# Patient Record
Sex: Female | Born: 1935 | Race: White | Hispanic: No | Marital: Married | Smoking: Never smoker
Health system: Southern US, Community
[De-identification: ages and names within clinical notes are randomized; demographics above are authoritative.]

## PROBLEM LIST (undated history)

## (undated) DIAGNOSIS — Z8719 Personal history of other diseases of the digestive system: Secondary | ICD-10-CM

## (undated) DIAGNOSIS — G2581 Restless legs syndrome: Secondary | ICD-10-CM

## (undated) DIAGNOSIS — T7840XA Allergy, unspecified, initial encounter: Secondary | ICD-10-CM

## (undated) DIAGNOSIS — D519 Vitamin B12 deficiency anemia, unspecified: Secondary | ICD-10-CM

## (undated) DIAGNOSIS — M5030 Other cervical disc degeneration, unspecified cervical region: Secondary | ICD-10-CM

## (undated) DIAGNOSIS — M8580 Other specified disorders of bone density and structure, unspecified site: Secondary | ICD-10-CM

## (undated) DIAGNOSIS — K635 Polyp of colon: Secondary | ICD-10-CM

## (undated) DIAGNOSIS — I10 Essential (primary) hypertension: Secondary | ICD-10-CM

## (undated) DIAGNOSIS — N895 Stricture and atresia of vagina: Secondary | ICD-10-CM

## (undated) DIAGNOSIS — I8390 Asymptomatic varicose veins of unspecified lower extremity: Secondary | ICD-10-CM

## (undated) HISTORY — PX: TUBAL LIGATION: SHX77

## (undated) HISTORY — DX: Other cervical disc degeneration, unspecified cervical region: M50.30

## (undated) HISTORY — PX: TONSILLECTOMY AND ADENOIDECTOMY: SUR1326

## (undated) HISTORY — DX: Essential (primary) hypertension: I10

## (undated) HISTORY — DX: Other specified disorders of bone density and structure, unspecified site: M85.80

## (undated) HISTORY — DX: Allergy, unspecified, initial encounter: T78.40XA

## (undated) HISTORY — PX: BILATERAL OOPHORECTOMY: SHX1221

## (undated) HISTORY — DX: Stricture and atresia of vagina: N89.5

## (undated) HISTORY — DX: Vitamin B12 deficiency anemia, unspecified: D51.9

## (undated) HISTORY — PX: OTHER SURGICAL HISTORY: SHX169

## (undated) HISTORY — DX: Restless legs syndrome: G25.81

## (undated) HISTORY — DX: Asymptomatic varicose veins of unspecified lower extremity: I83.90

## (undated) HISTORY — DX: Polyp of colon: K63.5

## (undated) HISTORY — PX: SALPINGECTOMY: SHX328

## (undated) HISTORY — DX: Personal history of other diseases of the digestive system: Z87.19

## (undated) HISTORY — PX: APPENDECTOMY: SHX54

---

## 1989-12-03 HISTORY — PX: ABDOMINAL HYSTERECTOMY: SHX81

## 2004-03-23 ENCOUNTER — Encounter: Payer: Self-pay | Admitting: Gastroenterology

## 2004-10-07 DIAGNOSIS — K7689 Other specified diseases of liver: Secondary | ICD-10-CM | POA: Insufficient documentation

## 2005-10-31 ENCOUNTER — Ambulatory Visit: Payer: Self-pay

## 2007-01-13 ENCOUNTER — Ambulatory Visit: Payer: Self-pay | Admitting: Gastroenterology

## 2008-01-25 ENCOUNTER — Ambulatory Visit: Payer: Self-pay | Admitting: Cardiology

## 2008-03-04 ENCOUNTER — Ambulatory Visit: Payer: Self-pay | Admitting: Pulmonary Disease

## 2008-03-04 DIAGNOSIS — J310 Chronic rhinitis: Secondary | ICD-10-CM | POA: Insufficient documentation

## 2008-03-04 DIAGNOSIS — G2581 Restless legs syndrome: Secondary | ICD-10-CM | POA: Insufficient documentation

## 2008-03-04 DIAGNOSIS — R05 Cough: Secondary | ICD-10-CM | POA: Insufficient documentation

## 2008-03-23 ENCOUNTER — Encounter (INDEPENDENT_AMBULATORY_CARE_PROVIDER_SITE_OTHER): Payer: Self-pay | Admitting: *Deleted

## 2008-03-23 ENCOUNTER — Ambulatory Visit: Payer: Self-pay | Admitting: Pulmonary Disease

## 2008-03-23 ENCOUNTER — Encounter: Payer: Self-pay | Admitting: Pulmonary Disease

## 2008-03-23 DIAGNOSIS — R0602 Shortness of breath: Secondary | ICD-10-CM | POA: Insufficient documentation

## 2008-04-05 ENCOUNTER — Ambulatory Visit (HOSPITAL_COMMUNITY): Admission: RE | Admit: 2008-04-05 | Discharge: 2008-04-05 | Payer: Self-pay | Admitting: Pulmonary Disease

## 2008-04-05 ENCOUNTER — Encounter: Payer: Self-pay | Admitting: Pulmonary Disease

## 2008-04-28 ENCOUNTER — Ambulatory Visit: Payer: Self-pay | Admitting: Internal Medicine

## 2008-05-21 ENCOUNTER — Ambulatory Visit: Payer: Self-pay | Admitting: Pulmonary Disease

## 2009-01-27 ENCOUNTER — Encounter: Admission: RE | Admit: 2009-01-27 | Discharge: 2009-01-27 | Payer: Self-pay | Admitting: Family Medicine

## 2009-01-27 ENCOUNTER — Encounter (INDEPENDENT_AMBULATORY_CARE_PROVIDER_SITE_OTHER): Payer: Self-pay | Admitting: *Deleted

## 2009-02-22 DIAGNOSIS — K922 Gastrointestinal hemorrhage, unspecified: Secondary | ICD-10-CM | POA: Insufficient documentation

## 2009-02-23 ENCOUNTER — Ambulatory Visit: Payer: Self-pay | Admitting: Gastroenterology

## 2009-02-23 DIAGNOSIS — R635 Abnormal weight gain: Secondary | ICD-10-CM | POA: Insufficient documentation

## 2009-02-23 DIAGNOSIS — K5730 Diverticulosis of large intestine without perforation or abscess without bleeding: Secondary | ICD-10-CM | POA: Insufficient documentation

## 2009-02-23 LAB — CONVERTED CEMR LAB
Albumin: 3.6 g/dL (ref 3.5–5.2)
BUN: 19 mg/dL (ref 6–23)
Basophils Relative: 0.8 % (ref 0.0–3.0)
CO2: 33 meq/L — ABNORMAL HIGH (ref 19–32)
Calcium: 9.4 mg/dL (ref 8.4–10.5)
Chloride: 102 meq/L (ref 96–112)
Creatinine, Ser: 0.9 mg/dL (ref 0.4–1.2)
Eosinophils Relative: 1.7 % (ref 0.0–5.0)
GFR calc non Af Amer: 64.84 mL/min (ref >60)
Glucose, Bld: 98 mg/dL (ref 70–99)
Hemoglobin: 13.7 g/dL (ref 12.0–15.0)
Lymphocytes Relative: 23.4 % (ref 12.0–46.0)
MCHC: 34.1 g/dL (ref 30.0–36.0)
Monocytes Relative: 9.2 % (ref 3.0–12.0)
Neutro Abs: 4.8 10*3/uL (ref 1.4–7.7)
Potassium: 4.9 meq/L (ref 3.5–5.1)
RBC: 4.35 M/uL (ref 3.87–5.11)
T4, Total: 6.6 ug/dL (ref 5.0–12.5)
WBC: 7.5 10*3/uL (ref 4.5–10.5)

## 2009-03-03 ENCOUNTER — Ambulatory Visit: Payer: Self-pay | Admitting: Gastroenterology

## 2009-10-07 DIAGNOSIS — N959 Unspecified menopausal and perimenopausal disorder: Secondary | ICD-10-CM | POA: Insufficient documentation

## 2009-10-07 DIAGNOSIS — M479 Spondylosis, unspecified: Secondary | ICD-10-CM | POA: Insufficient documentation

## 2009-11-21 ENCOUNTER — Ambulatory Visit: Admission: RE | Admit: 2009-11-21 | Discharge: 2009-11-21 | Payer: Self-pay | Admitting: Gynecologic Oncology

## 2011-04-20 NOTE — Letter (Signed)
January 13, 2007    Mrs. Veronica Mullins   RE:  Bezanson, Leylany  MRN:  [id number]  /  DOB:  12/31/1930   Dear [name]:   It is my pleasure to have treated you recently as a new patient in my  office. I appreciate your confidence and the opportunity to participate  in your care.   Since I do have a busy inpatient endoscopy schedule and office schedule,  my office hours vary weekly. I am, however, available for emergency  calls every day through my office. If I cannot promptly meet an urgent  office appointment, another one of our gastroenterologists will be able  to assist you.   My well-trained staff are prepared to help you at all times. For  emergencies after office hours, a physician from our gastroenterology  section is always available through my 24-hour answering service.   While you are under my care, I encourage discussion of your questions  and concerns, and I will be happy to return your calls as soon as I am  available.   Once again, I welcome you as a new patient and I look forward to a happy  and healthy relationship.    Sincerely,      Robert D. Kaplan, MD,FACG  Electronically Signed   RDK/MedQ  DD: 01/13/2007  DT: 01/13/2007  Job #: [id number] 

## 2011-04-20 NOTE — Letter (Signed)
January 13, 2007    Jonita Albee, M.D.  [facility]  [address]  [place], [place] [address]   RE:  Veronica Mullins, Veronica Mullins  MRN:  [id number]  /  DOB:  08/12/1931   Dear Dr. Perrin Maltese:   Upon your kind referral, I had the pleasure of evaluating your patient  and I am pleased to offer my findings. I saw Veronica Mullins in the office  today. Enclosed is a copy of my progress note that details my findings  and recommendations.   Thank you for the opportunity to participate in your patient's care.    Sincerely,      Barbette Hair. Arlyce Dice, MD,FACG  Electronically Signed    RDK/MedQ  DD: 01/13/2007  DT: 01/13/2007  Job #: [id number]

## 2011-04-20 NOTE — Assessment & Plan Note (Signed)
[facility]                         GASTROENTEROLOGY OFFICE NOTE   Veronica Mullins, Veronica Mullins                          MRN:          [id number]  DATE:01/13/2007                            DOB:          03-22-1931    REASON FOR CONSULTATION:  History of hematochezia.   Veronica Mullins is a 77 year old white female referred to the courtesy of Dr.  Perrin Maltese and for evaluation.  In 2005, she underwent incomplete colonoscopy  and barium enema for acute lower GI bleeding.  Colonoscopy to the left  colon demonstrated scattered diverticula.  Air contrast barium enema  demonstrated the same.  The remainder of the colon apparently was  normal.  Diverticula were confined to the sigmoid colon.  Veronica Mullins has  no GI complaints, including change of bowel habits, abdominal pain,  melena or hematochezia.   PAST MEDICAL HISTORY:  Pertinent for hypertension and arthritis.  She  apparently has had asthma for 2 years.  She is status post  hemorrhoidectomy, history, tubal ligation and appendectomy.  She has  restless leg syndrome.   FAMILY HISTORY:  Pertinent for mother with breast cancer.   MEDICATIONS:  Carbidopa, Topamax, Lisinopril, Proventil, K-Var spray and  B12 injections.   ALLERGIES:  SHE IS ALLERGIC TO PENICILLIN.   SOCIAL HISTORY:  She neither smokes nor drinks.  She is married and  works for the [employer] system.   REVIEW OF SYSTEMS:  Reviewed and is positive for feet swelling, frequent  nonproductive cough, joint pains, sleep problems, back pain and some  shortness of breath.   PHYSICAL EXAMINATION:  VITAL SIGNS:  Pulse 76, blood pressure 118/68,  weight 163.  HEENT:  EOMI. PERRLA. Sclerae are anicteric.  Conjunctivae are pink.  NECK:  Supple without thyromegaly, adenopathy or carotid bruits.  CHEST:  Clear to auscultation and percussion without adventitious  sounds.  CARDIAC:  Regular rhythm; normal S1 S2.  There are no murmurs, gallops  or rubs.  ABDOMEN:  Bowel sounds are normoactive.  Abdomen is soft, non-tender and  non-distended.  There are no abdominal masses, tenderness, splenic  enlargement or hepatomegaly.  EXTREMITIES:  Full range of motion.  No cyanosis, clubbing or edema.  RECTAL:  There are no masses.  Stool is Hemoccult negative.   IMPRESSION:  1. History of lower GI bleeding - most likely secondary to      diverticulosis.  2. Asthma.  Recent onset of asthma raises the question of asthma      secondary to acid reflux.   RECOMMENDATION:  No further GI workup at this time.     Barbette Hair. Arlyce Dice, MD,FACG  Electronically Signed    RDK/MedQ  DD: 01/13/2007  DT: 01/13/2007  Job #: [id number]   cc:   Jonita Albee, M.D.

## 2011-04-20 NOTE — Letter (Signed)
January 13, 2007    Mrs. Veronica Mullins   RE:  MAELA, TAKEDA  MRN:  [id number]  /  DOB:  01/12/1931   Dear [name]:   It is my pleasure to have treated you recently as a new patient in my  office. I appreciate your confidence and the opportunity to participate  in your care.   Since I do have a busy inpatient endoscopy schedule and office schedule,  my office hours vary weekly. I am, however, available for emergency  calls every day through my office. If I cannot promptly meet an urgent  office appointment, another one of our gastroenterologists will be able  to assist you.   My well-trained staff are prepared to help you at all times. For  emergencies after office hours, a physician from our gastroenterology  section is always available through my 24-hour answering service.   While you are under my care, I encourage discussion of your questions  and concerns, and I will be happy to return your calls as soon as I am  available.   Once again, I welcome you as a new patient and I look forward to a happy  and healthy relationship.    Sincerely,      Barbette Hair. Arlyce Dice, MD,FACG  Electronically Signed   RDK/MedQ  DD: 01/13/2007  DT: 01/13/2007  Job #: [id number]

## 2011-04-20 NOTE — Letter (Signed)
January 13, 2007    Jonita Albee, M.D.  [facility]  [address]  [place]  [place] [address]   RE:  [name], [name]  MRN:  [id number]  /  DOB:  Jan 06, 1931   Dear Dr. Perrin Maltese:   Upon your kind referral, I had the pleasure of evaluating your patient  and I am pleased to offer my findings.  I saw Mrs. Veronica Mullins in the  office today.  Enclosed is a copy of my progress note that details my  findings and recommendations.   Thank you for the opportunity to participate in your patient's care.    Sincerely,      Barbette Hair. Arlyce Dice, MD,FACG  Electronically Signed    RDK/MedQ  DD: 01/13/2007  DT: 01/13/2007  Job #: [id number]

## 2011-11-20 ENCOUNTER — Ambulatory Visit (INDEPENDENT_AMBULATORY_CARE_PROVIDER_SITE_OTHER): Payer: Medicare Other | Admitting: Physician Assistant

## 2011-11-20 DIAGNOSIS — R209 Unspecified disturbances of skin sensation: Secondary | ICD-10-CM

## 2011-11-20 DIAGNOSIS — E538 Deficiency of other specified B group vitamins: Secondary | ICD-10-CM

## 2011-11-20 DIAGNOSIS — G8929 Other chronic pain: Secondary | ICD-10-CM

## 2011-11-20 DIAGNOSIS — I10 Essential (primary) hypertension: Secondary | ICD-10-CM

## 2011-12-04 HISTORY — PX: CARPAL TUNNEL RELEASE: SHX101

## 2011-12-05 ENCOUNTER — Other Ambulatory Visit: Payer: Self-pay | Admitting: Emergency Medicine

## 2011-12-05 DIAGNOSIS — M542 Cervicalgia: Secondary | ICD-10-CM

## 2011-12-07 ENCOUNTER — Other Ambulatory Visit: Payer: Self-pay

## 2011-12-13 ENCOUNTER — Ambulatory Visit
Admission: RE | Admit: 2011-12-13 | Discharge: 2011-12-13 | Disposition: A | Discharge status: Home / Self Care | Payer: Medicare Other | Source: Ambulatory Visit | Attending: Emergency Medicine | Admitting: Emergency Medicine

## 2011-12-13 DIAGNOSIS — M542 Cervicalgia: Secondary | ICD-10-CM

## 2012-02-21 ENCOUNTER — Ambulatory Visit (INDEPENDENT_AMBULATORY_CARE_PROVIDER_SITE_OTHER): Payer: Medicare Other | Admitting: Physician Assistant

## 2012-02-21 ENCOUNTER — Encounter: Payer: Self-pay | Admitting: Physician Assistant

## 2012-02-21 VITALS — BP 116/68 | HR 87 | Temp 98.7°F | Resp 16 | Ht 62.5 in | Wt 167.4 lb

## 2012-02-21 DIAGNOSIS — J309 Allergic rhinitis, unspecified: Secondary | ICD-10-CM | POA: Insufficient documentation

## 2012-02-21 DIAGNOSIS — M8580 Other specified disorders of bone density and structure, unspecified site: Secondary | ICD-10-CM | POA: Insufficient documentation

## 2012-02-21 DIAGNOSIS — I10 Essential (primary) hypertension: Secondary | ICD-10-CM

## 2012-02-21 DIAGNOSIS — D519 Vitamin B12 deficiency anemia, unspecified: Secondary | ICD-10-CM | POA: Insufficient documentation

## 2012-02-21 DIAGNOSIS — G5600 Carpal tunnel syndrome, unspecified upper limb: Secondary | ICD-10-CM

## 2012-02-21 DIAGNOSIS — R202 Paresthesia of skin: Secondary | ICD-10-CM

## 2012-02-21 DIAGNOSIS — G5602 Carpal tunnel syndrome, left upper limb: Secondary | ICD-10-CM

## 2012-02-21 DIAGNOSIS — G2581 Restless legs syndrome: Secondary | ICD-10-CM

## 2012-02-21 DIAGNOSIS — G8929 Other chronic pain: Secondary | ICD-10-CM | POA: Insufficient documentation

## 2012-02-21 DIAGNOSIS — M5030 Other cervical disc degeneration, unspecified cervical region: Secondary | ICD-10-CM

## 2012-02-21 DIAGNOSIS — I8390 Asymptomatic varicose veins of unspecified lower extremity: Secondary | ICD-10-CM | POA: Insufficient documentation

## 2012-02-21 MED ORDER — SPIRONOLACTONE 50 MG PO TABS: 50.0000 mg | ORAL_TABLET | Freq: Every day | ORAL | Status: DC

## 2012-02-21 MED ORDER — AMITRIPTYLINE HCL 100 MG PO TABS: 50.0000 mg | ORAL_TABLET | Freq: Every day | ORAL | Status: DC

## 2012-02-21 MED ORDER — DULOXETINE HCL 60 MG PO CPEP: 60.0000 mg | ORAL_CAPSULE | Freq: Every day | ORAL | Status: DC

## 2012-02-21 MED ORDER — CARVEDILOL 6.25 MG PO TABS: 6.2500 mg | ORAL_TABLET | Freq: Two times a day (BID) | ORAL | Status: DC

## 2012-02-21 MED ORDER — ESOMEPRAZOLE MAGNESIUM 40 MG PO CPDR: 40.0000 mg | DELAYED_RELEASE_CAPSULE | Freq: Every day | ORAL | Status: DC

## 2012-02-21 NOTE — Patient Instructions (Signed)
If you haven't heard about the referral for the carpal tunnel  In 10 days, please call our office.  You're welcome to contact local offices to see if anyone administers the osteoporosis injectable.

## 2012-02-21 NOTE — Progress Notes (Signed)
  Subjective:    Patient ID: Veronica Mullins, female    DOB: 1931-03-29, 78 y.o.   MRN: [id number]  HPI Presents for re-evaluation of numbness in the left hand. Has had 9 PT treatments.  Initially it helped.  Then felt like one therapist over-did it and had lots of neck soreness.  Hasn't had any improvement in the numbness overall. Saw her neurologist last week and had NCS revealing both the cervical problem and CTS, and he recommended release.  Aggravating, doesn't really hurt. She isn't sure she really wants to have surgery.  Still has a bit of a runny nose and mild cough, but doesn't feel bad. She needs refills of her meds for HTN, RLS, and reflux.   Review of Systems As above.    Objective:   Physical Exam Vital signs noted. Well-developed, well nourished WF who is awake, alert and oriented, in NAD. HEENT: [place]/AT, sclera and conjunctiva are clear.   Neck: supple, non-tender, no lymphadenopathy, thyromegaly. Heart: RRR, no murmur Lungs: CTA Extremities: no cyanosis, clubbing but with mild LE edema bilaterally. Skin: warm and dry without rash.     Assessment & Plan:   1. Carpal tunnel syndrome of left wrist  Ambulatory referral to Orthopedic Surgery  2. Degenerative disc disease, cervical    3. Paresthesias in left hand    4. Restless legs syndrome (RLS)    5. HTN (hypertension)     Refilled Cymbalta, aldactone, carvedilol and nexium.  Also restarted amitriptyline-thinks she slept better with it. Re-evaluate in 4 months.

## 2012-04-04 ENCOUNTER — Other Ambulatory Visit: Payer: Self-pay | Admitting: Family Medicine

## 2012-04-04 MED ORDER — HYDROCODONE-ACETAMINOPHEN 10-500 MG PO TABS: 1.0000 | ORAL_TABLET | Freq: Two times a day (BID) | ORAL | Status: DC

## 2012-05-20 ENCOUNTER — Other Ambulatory Visit: Payer: Self-pay | Admitting: Physician Assistant

## 2012-06-26 ENCOUNTER — Encounter: Payer: Self-pay | Admitting: Physician Assistant

## 2012-06-26 ENCOUNTER — Ambulatory Visit (INDEPENDENT_AMBULATORY_CARE_PROVIDER_SITE_OTHER): Payer: Medicare Other | Admitting: Physician Assistant

## 2012-06-26 VITALS — BP 118/68 | HR 88 | Temp 98.4°F | Resp 16 | Ht 62.75 in | Wt 172.6 lb

## 2012-06-26 DIAGNOSIS — I10 Essential (primary) hypertension: Secondary | ICD-10-CM

## 2012-06-26 DIAGNOSIS — F419 Anxiety disorder, unspecified: Secondary | ICD-10-CM

## 2012-06-26 DIAGNOSIS — G8929 Other chronic pain: Secondary | ICD-10-CM

## 2012-06-26 DIAGNOSIS — E538 Deficiency of other specified B group vitamins: Secondary | ICD-10-CM

## 2012-06-26 DIAGNOSIS — R05 Cough: Secondary | ICD-10-CM

## 2012-06-26 DIAGNOSIS — F329 Major depressive disorder, single episode, unspecified: Secondary | ICD-10-CM

## 2012-06-26 DIAGNOSIS — F341 Dysthymic disorder: Secondary | ICD-10-CM

## 2012-06-26 LAB — COMPREHENSIVE METABOLIC PANEL
ALT: 10 U/L (ref 0–35)
Alkaline Phosphatase: 93 U/L (ref 39–117)
Creat: 0.79 mg/dL (ref 0.50–1.10)
Sodium: 139 mEq/L (ref 135–145)
Total Bilirubin: 0.5 mg/dL (ref 0.3–1.2)
Total Protein: 6.9 g/dL (ref 6.0–8.3)

## 2012-06-26 LAB — VITAMIN B12: Vitamin B-12: 467 pg/mL (ref 211–911)

## 2012-06-26 MED ORDER — VENLAFAXINE HCL ER 75 MG PO CP24: 75.0000 mg | ORAL_CAPSULE | Freq: Every day | ORAL | Status: DC

## 2012-06-26 MED ORDER — HYDROCODONE-ACETAMINOPHEN 10-500 MG PO TABS: 1.0000 | ORAL_TABLET | Freq: Three times a day (TID) | ORAL | Status: DC | PRN

## 2012-06-26 MED ORDER — IPRATROPIUM BROMIDE 0.03 % NA SOLN: 2.0000 | Freq: Two times a day (BID) | NASAL | Status: DC

## 2012-06-26 MED ORDER — MOXIFLOXACIN HCL 400 MG PO TABS: 400.0000 mg | ORAL_TABLET | Freq: Every day | ORAL | Status: AC

## 2012-06-26 MED ORDER — GUAIFENESIN ER 1200 MG PO TB12: 1.0000 | ORAL_TABLET | Freq: Two times a day (BID) | ORAL | Status: DC | PRN

## 2012-06-26 NOTE — Patient Instructions (Signed)
Keep doing-the things you want to and can.

## 2012-06-26 NOTE — Progress Notes (Signed)
Subjective:    Patient ID: Veronica Mullins, female    DOB: 08-24-1931, 78 y.o.   MRN: [id number]  HPI This 78 y.o. Female presents for follow-up of chronic problems, and recurrent cough.  She's had increased sinus pressure and drainage, which she reports tastes foul, and her cough produces yellow sputum.  No SOB.  Also, her mood remains low, "blue."  She notes irritability.  She's been taking only 30 mg of Cymbalta for the last month, since it's no longer covered by her pharmacy benefit plan. Her portion is $80/month (was started by Dr. Hal Hope for anxiety).  She'd like to try an alternative.  Review of Systems  Constitutional: Positive for unexpected weight change (up and down, sometimes daily (ongoing x months-years)).  HENT: Positive for hearing loss (baseline), congestion, rhinorrhea, postnasal drip and sinus pressure. Negative for ear pain, sore throat and trouble swallowing.   Eyes: Negative.   Respiratory: Positive for cough, shortness of breath and wheezing.   Cardiovascular: Positive for leg swelling. Negative for chest pain and palpitations.  Gastrointestinal: Negative.   Genitourinary: Negative.   Musculoskeletal: Positive for myalgias, back pain and arthralgias. Negative for joint swelling and gait problem.  Skin: Negative.   Neurological: Negative.   Hematological: Negative.   Psychiatric/Behavioral: Positive for dysphoric mood. Negative for self-injury. The patient is nervous/anxious.       Past Medical History  Diagnosis Date  . Allergy   . Hypertension   . Varicose veins   . RLS (restless legs syndrome)   . Asthma   . B12 deficiency anemia   . History of GI bleed     colon polyps  . Colon polyps   . Osteopenia   . Vaginal stricture   . Vitamin d deficiency   . DDD (degenerative disc disease), cervical     causes numbness in left fingers, but hopes to avoid surgery    Past Surgical History  Procedure Date  . Tonsillectomy and adenoidectomy   . Tubal ligation     . Appendectomy   . Cataract   . Bilateral oophorectomy   . Salpingectomy     bilateral  . Carpal tunnel release 2013    left hand    Prior to Admission medications   Medication Sig Start Date End Date Taking? Authorizing Provider  albuterol (PROVENTIL HFA;VENTOLIN HFA) 108 (90 BASE) MCG/ACT inhaler Inhale 2 puffs into the lungs every 6 (six) hours as needed.   Yes Historical Provider, MD  alendronate (FOSAMAX) 70 MG tablet Take 70 mg by mouth every 7 (seven) days. Take with a full glass of water on an empty stomach.   Yes Historical Provider, MD  amitriptyline (ELAVIL) 100 MG tablet Take 0.5-1 tablets (50-100 mg total) by mouth at bedtime. 02/21/12 02/20/13 Yes Noboru Bidinger S Diera Wirkkala, PA-C  carbidopa-levodopa (SINEMET IR) 25-250 MG per tablet  06/06/12  Yes Historical Provider, MD  carvedilol (COREG) 6.25 MG tablet Take 1 tablet (6.25 mg total) by mouth 2 (two) times daily with a meal. 02/21/12  Yes Araina Butrick S Agnes Brightbill, PA-C  DULoxetine (CYMBALTA) 60 MG capsule Take 1 capsule (60 mg total) by mouth daily. 02/21/12  Yes Makana Feigel S Ajwa Kimberley, PA-C  esomeprazole (NEXIUM) 40 MG capsule Take 1 capsule (40 mg total) by mouth daily before breakfast. 02/21/12  Yes Larenda Reedy S Tanielle Emigh, PA-C  Fluticasone-Salmeterol (ADVAIR DISKUS) 250-50 MCG/DOSE AEPB Inhale 1 puff into the lungs every 12 (twelve) hours.   Yes Historical Provider, MD  HYDROcodone-acetaminophen (LORTAB) 10-500 MG per tablet TAKE 1 TABLET  BY MOUTH TWICE DAILY 05/20/12  Yes Pattricia Boss, PA-C  Rotigotine (NEUPRO) 3 MG/24HR PT24 Place onto the skin.   Yes Historical Provider, MD  spironolactone (ALDACTONE) 50 MG tablet Take 1 tablet (50 mg total) by mouth daily. 02/21/12  Yes Daneli Butkiewicz S Marvelous Woolford, PA-C  vitamin B-12 (CYANOCOBALAMIN) 1000 MCG tablet Take 1,000 mcg by mouth daily.   Yes Historical Provider, MD    Allergies  Allergen Reactions  . Penicillins     Throat swells    History   Social History  . Marital Status: Married    Spouse Name: N/A     Number of Children: N/A  . Years of Education: N/A   Occupational History  . Not on file.   Social History Main Topics  . Smoking status: Never Smoker   . Smokeless tobacco: Not on file  . Alcohol Use: No  . Drug Use: No  . Sexually Active: Not on file   Other Topics Concern  . Not on file   Social History Narrative  . No narrative on file    Family History  Problem Relation Age of Onset  . Cancer Mother     breast  . Cancer Father     prostate       Objective:   Physical Exam  Vitals reviewed. Constitutional: She is oriented to person, place, and time. Vital signs are normal. She appears well-developed and well-nourished. No distress.  HENT:  Head: Normocephalic and atraumatic.  Right Ear: Hearing, tympanic membrane, external ear and ear canal normal.  Left Ear: Hearing, tympanic membrane, external ear and ear canal normal.  Nose: Mucosal edema and rhinorrhea present.  No foreign bodies. Right sinus exhibits maxillary sinus tenderness. Right sinus exhibits no frontal sinus tenderness. Left sinus exhibits maxillary sinus tenderness. Left sinus exhibits no frontal sinus tenderness.  Mouth/Throat: Uvula is midline, oropharynx is clear and moist and mucous membranes are normal. No uvula swelling. No oropharyngeal exudate.       Wears bilateral hearing aids  Eyes: Conjunctivae and EOM are normal. Pupils are equal, round, and reactive to light. Right eye exhibits no discharge. Left eye exhibits no discharge. No scleral icterus.  Neck: Trachea normal, normal range of motion and full passive range of motion without pain. Neck supple. No mass and no thyromegaly present.  Cardiovascular: Normal rate, regular rhythm and normal heart sounds.   Pulmonary/Chest: Effort normal. She has no decreased breath sounds. She has wheezes in the right lower field. She has rhonchi in the right lower field. She has no rales.  Musculoskeletal:       Lumbar back: She exhibits tenderness and pain.  She exhibits no bony tenderness, no swelling, no edema and no spasm.  Lymphadenopathy:       Head (right side): No submandibular, no tonsillar, no preauricular, no posterior auricular and no occipital adenopathy present.       Head (left side): No submandibular, no tonsillar, no preauricular and no occipital adenopathy present.    She has no cervical adenopathy.       Right: No supraclavicular adenopathy present.       Left: No supraclavicular adenopathy present.  Neurological: She is alert and oriented to person, place, and time. She has normal strength. No cranial nerve deficit or sensory deficit.  Skin: Skin is warm, dry and intact. No rash noted.  Psychiatric: She has a normal mood and affect. Her speech is normal and behavior is normal.      Assessment &  Plan:   1. Chronic pain  Continue Norco as before  2. Cough, due to reactive airways caused by sinusitis drainage  moxifloxacin (AVELOX) 400 MG tablet, ipratropium (ATROVENT) 0.03 % nasal spray, Guaifenesin (MUCINEX MAXIMUM STRENGTH) 1200 MG TB12  3. HTN (hypertension)  Comprehensive metabolic panel; continue current regimen  4. B12 deficiency  Vitamin B12 level (send result to neurologist)  5. Anxiety and depression  venlafaxine XR (EFFEXOR XR) 75 MG 24 hr capsule

## 2012-06-27 ENCOUNTER — Encounter: Payer: Self-pay | Admitting: Physician Assistant

## 2012-07-03 ENCOUNTER — Other Ambulatory Visit: Payer: Self-pay | Admitting: Physician Assistant

## 2012-07-31 ENCOUNTER — Ambulatory Visit (INDEPENDENT_AMBULATORY_CARE_PROVIDER_SITE_OTHER): Payer: Medicare Other | Admitting: Physician Assistant

## 2012-07-31 ENCOUNTER — Encounter: Payer: Self-pay | Admitting: Physician Assistant

## 2012-07-31 VITALS — BP 118/62 | HR 65 | Temp 98.0°F | Resp 16 | Ht 62.5 in | Wt 171.6 lb

## 2012-07-31 DIAGNOSIS — F341 Dysthymic disorder: Secondary | ICD-10-CM

## 2012-07-31 DIAGNOSIS — F329 Major depressive disorder, single episode, unspecified: Secondary | ICD-10-CM

## 2012-07-31 DIAGNOSIS — G8929 Other chronic pain: Secondary | ICD-10-CM

## 2012-07-31 DIAGNOSIS — T753XXA Motion sickness, initial encounter: Secondary | ICD-10-CM

## 2012-07-31 DIAGNOSIS — J45909 Unspecified asthma, uncomplicated: Secondary | ICD-10-CM

## 2012-07-31 MED ORDER — VENLAFAXINE HCL ER 75 MG PO CP24: 75.0000 mg | ORAL_CAPSULE | Freq: Every day | ORAL | Status: DC

## 2012-07-31 MED ORDER — SCOPOLAMINE 1 MG/3DAYS TD PT72: 1.0000 | MEDICATED_PATCH | TRANSDERMAL | Status: DC

## 2012-07-31 MED ORDER — HYDROCODONE-ACETAMINOPHEN 10-500 MG PO TABS: 1.0000 | ORAL_TABLET | Freq: Three times a day (TID) | ORAL | Status: DC | PRN

## 2012-07-31 MED ORDER — VENLAFAXINE HCL ER 150 MG PO CP24: 75.0000 mg | ORAL_CAPSULE | Freq: Every day | ORAL | Status: DC

## 2012-07-31 NOTE — Progress Notes (Addendum)
Subjective:    Patient ID: Veronica Mullins, female    DOB: 05-02-1931, 78 y.o.   MRN: [id number]  HPI  This 78 y.o. female presents for evaluation of recent asthma.  Tolerated the Avelox without difficulty.  Has had intermittent wheezing, but not persistently.  Her primary concern remains daily fluctuations in weight, as much as 7 pounds over the course of the day. She is wearing compression stockings with good results on the ankle swelling.  She wrote down her weights, as I asked, but forgot to bring it with her. She thinks her anxiety and depression would be better controlled on a higher dose of venlafaxine (currently on 75 mg). Otherwise, she's doing well, and is preparing for a cruise with her family this Thanksgiving. She requests a prescription for seasickness.   Review of Systems No chest pain, HA, dizziness, vision change, N/V, diarrhea, constipation, dysuria, urinary urgency or frequency, or rash.   Past Medical History  Diagnosis Date  . Allergy   . Hypertension   . Varicose veins   . RLS (restless legs syndrome)   . Asthma   . B12 deficiency anemia   . History of GI bleed     colon polyps  . Colon polyps   . Osteopenia   . Vaginal stricture   . Vitamin d deficiency   . DDD (degenerative disc disease), cervical     causes numbness in left fingers, but hopes to avoid surgery    Past Surgical History  Procedure Date  . Tonsillectomy and adenoidectomy   . Tubal ligation   . Appendectomy   . Cataract   . Bilateral oophorectomy   . Salpingectomy     bilateral  . Carpal tunnel release 2013    left hand    Prior to Admission medications   Medication Sig Start Date End Date Taking? Authorizing Provider  albuterol (PROVENTIL HFA;VENTOLIN HFA) 108 (90 BASE) MCG/ACT inhaler Inhale 2 puffs into the lungs every 6 (six) hours as needed.   Yes Historical Provider, MD  alendronate (FOSAMAX) 70 MG tablet Take 70 mg by mouth every 7 (seven) days. Take with a full glass of water on  an empty stomach.   Yes Historical Provider, MD  amitriptyline (ELAVIL) 100 MG tablet Take 0.5-1 tablets (50-100 mg total) by mouth at bedtime. 02/21/12 02/20/13 Yes Traeh Milroy S Shenice Dolder, PA-C  carbidopa-levodopa (SINEMET IR) 25-250 MG per tablet  06/06/12  Yes Historical Provider, MD  carvedilol (COREG) 6.25 MG tablet Take 1 tablet (6.25 mg total) by mouth 2 (two) times daily with a meal. 02/21/12  Yes Zacarias Krauter S Vidit Boissonneault, PA-C  esomeprazole (NEXIUM) 40 MG capsule Take 1 capsule (40 mg total) by mouth daily before breakfast. 02/21/12  Yes Kimberlynn Lumbra S Zamira Hickam, PA-C  Fluticasone-Salmeterol (ADVAIR DISKUS) 250-50 MCG/DOSE AEPB Inhale 1 puff into the lungs every 12 (twelve) hours.   Yes Historical Provider, MD  HYDROcodone-acetaminophen (LORTAB) 10-500 MG per tablet Take 1 tablet by mouth every 8 (eight) hours as needed for pain. 06/26/12  Yes Corianna Avallone S Kielyn Kardell, PA-C  ipratropium (ATROVENT) 0.03 % nasal spray Place 2 sprays into the nose 2 (two) times daily. 06/26/12 06/26/13 Yes Jodeci Rini S Giannah Zavadil, PA-C  Rotigotine (NEUPRO) 3 MG/24HR PT24 Place onto the skin.   Yes Historical Provider, MD  spironolactone (ALDACTONE) 50 MG tablet Take 1 tablet (50 mg total) by mouth daily. 02/21/12  Yes Ryanna Teschner S Murrell Dome, PA-C  venlafaxine XR (EFFEXOR XR) 75 MG 24 hr capsule Take 1 capsule (75 mg total) by mouth  daily. 06/26/12 06/26/13 Yes Zariah Cavendish S Margarite Vessel, PA-C  vitamin B-12 (CYANOCOBALAMIN) 1000 MCG tablet Inject 1,000 mcg as directed every 30 (thirty) days.    Yes Historical Provider, MD    Allergies  Allergen Reactions  . Penicillins     Throat swells    History   Social History  . Marital Status: Married    Spouse Name: Alden Server    Number of Children: 2  . Years of Education: 14   Occupational History  . Retired    Social History Main Topics  . Smoking status: Never Smoker   . Smokeless tobacco: Never Used  . Alcohol Use: No  . Drug Use: No  . Sexually Active: No   Other Topics Concern  . Not on file   Social History  Narrative  . No narrative on file    Family History  Problem Relation Age of Onset  . Cancer Mother     breast  . Cancer Father     prostate       Objective:   Physical Exam  Blood pressure 118/62, pulse 65, temperature 98 F (36.7 C), temperature source Oral, resp. rate 16, height 5' 2.5" (1.588 m), weight 171 lb 9.6 oz (77.837 kg), SpO2 94.00%. Body mass index is 30.89 kg/(m^2). Well-developed, well nourished WF who is awake, alert and oriented, in NAD. HEENT: [place]/AT, PERRL, EOMI.  Sclera and conjunctiva are clear.  Bilateral hearing aids. EAC are patent, TMs are normal in appearance. Nasal mucosa is pink and moist. OP is clear. Neck: supple, non-tender, no lymphadenopathy, thyromegaly. Heart: RRR, no murmur Lungs: normal effort, CTA without wheezing. Abdomen: normo-active bowel sounds, supple, non-tender, no mass or organomegaly. Extremities: no cyanosis, clubbing or edema. Skin: warm and dry without rash.     Assessment & Plan:   1. Anxiety and depression  venlafaxine XR (EFFEXOR XR) 150 MG 24 hr capsule, DISCONTINUED: venlafaxine XR (EFFEXOR XR) 75 MG 24 hr capsule  2. Chronic pain  HYDROcodone-acetaminophen (LORTAB) 10-500 MG per tablet  3. Seasickness  scopolamine (TRANSDERM-SCOP) 1.5 MG

## 2012-07-31 NOTE — Patient Instructions (Signed)
At your next visit, we'll update fasting labs, unless you've had them done elsewhere in the meantime. ENJOY yourself on the cruise!

## 2012-09-15 ENCOUNTER — Other Ambulatory Visit: Payer: Self-pay | Admitting: Physician Assistant

## 2012-09-16 NOTE — Telephone Encounter (Signed)
At tl desk 

## 2012-09-17 ENCOUNTER — Other Ambulatory Visit: Payer: Self-pay

## 2012-11-04 ENCOUNTER — Ambulatory Visit: Payer: Medicare Other | Admitting: Physician Assistant

## 2012-11-13 ENCOUNTER — Encounter: Payer: Self-pay | Admitting: Physician Assistant

## 2012-11-13 ENCOUNTER — Ambulatory Visit (INDEPENDENT_AMBULATORY_CARE_PROVIDER_SITE_OTHER): Payer: Medicare Other | Admitting: Physician Assistant

## 2012-11-13 VITALS — BP 129/71 | HR 90 | Temp 98.1°F | Resp 18 | Ht 63.5 in | Wt 167.0 lb

## 2012-11-13 DIAGNOSIS — K219 Gastro-esophageal reflux disease without esophagitis: Secondary | ICD-10-CM

## 2012-11-13 DIAGNOSIS — F329 Major depressive disorder, single episode, unspecified: Secondary | ICD-10-CM

## 2012-11-13 DIAGNOSIS — J45909 Unspecified asthma, uncomplicated: Secondary | ICD-10-CM

## 2012-11-13 DIAGNOSIS — I10 Essential (primary) hypertension: Secondary | ICD-10-CM

## 2012-11-13 DIAGNOSIS — D518 Other vitamin B12 deficiency anemias: Secondary | ICD-10-CM

## 2012-11-13 DIAGNOSIS — R059 Cough, unspecified: Secondary | ICD-10-CM

## 2012-11-13 DIAGNOSIS — E039 Hypothyroidism, unspecified: Secondary | ICD-10-CM

## 2012-11-13 DIAGNOSIS — R05 Cough: Secondary | ICD-10-CM

## 2012-11-13 DIAGNOSIS — J310 Chronic rhinitis: Secondary | ICD-10-CM

## 2012-11-13 DIAGNOSIS — M899 Disorder of bone, unspecified: Secondary | ICD-10-CM

## 2012-11-13 DIAGNOSIS — F32A Depression, unspecified: Secondary | ICD-10-CM

## 2012-11-13 DIAGNOSIS — G8929 Other chronic pain: Secondary | ICD-10-CM

## 2012-11-13 DIAGNOSIS — M8580 Other specified disorders of bone density and structure, unspecified site: Secondary | ICD-10-CM

## 2012-11-13 DIAGNOSIS — F419 Anxiety disorder, unspecified: Secondary | ICD-10-CM | POA: Insufficient documentation

## 2012-11-13 DIAGNOSIS — D519 Vitamin B12 deficiency anemia, unspecified: Secondary | ICD-10-CM

## 2012-11-13 MED ORDER — ALBUTEROL SULFATE HFA 108 (90 BASE) MCG/ACT IN AERS: 2.0000 | INHALATION_SPRAY | RESPIRATORY_TRACT | Status: DC | PRN

## 2012-11-13 MED ORDER — FLUTICASONE-SALMETEROL 250-50 MCG/DOSE IN AEPB: 1.0000 | INHALATION_SPRAY | Freq: Two times a day (BID) | RESPIRATORY_TRACT | Status: DC

## 2012-11-13 MED ORDER — AMITRIPTYLINE HCL 100 MG PO TABS: 50.0000 mg | ORAL_TABLET | Freq: Every day | ORAL | Status: DC

## 2012-11-13 MED ORDER — DULOXETINE HCL 60 MG PO CPEP: 60.0000 mg | ORAL_CAPSULE | Freq: Every day | ORAL | Status: DC

## 2012-11-13 MED ORDER — HYDROCODONE-ACETAMINOPHEN 10-500 MG PO TABS: 1.0000 | ORAL_TABLET | Freq: Three times a day (TID) | ORAL | Status: DC | PRN

## 2012-11-13 MED ORDER — IPRATROPIUM BROMIDE 0.03 % NA SOLN: 2.0000 | Freq: Two times a day (BID) | NASAL | Status: DC

## 2012-11-13 MED ORDER — CARVEDILOL 6.25 MG PO TABS: 6.2500 mg | ORAL_TABLET | Freq: Two times a day (BID) | ORAL | Status: DC

## 2012-11-13 MED ORDER — ALENDRONATE SODIUM 70 MG PO TABS: 70.0000 mg | ORAL_TABLET | ORAL | Status: DC

## 2012-11-13 MED ORDER — SPIRONOLACTONE 50 MG PO TABS: 50.0000 mg | ORAL_TABLET | Freq: Every day | ORAL | Status: DC

## 2012-11-13 MED ORDER — ESOMEPRAZOLE MAGNESIUM 40 MG PO CPDR: 40.0000 mg | DELAYED_RELEASE_CAPSULE | Freq: Every day | ORAL | Status: DC

## 2012-11-13 NOTE — Patient Instructions (Signed)
Continue the antibiotic as prescribed.  You should have a follow-up chest xray in 4 weeks.  Continue to rest and drink fluids.

## 2012-11-13 NOTE — Progress Notes (Signed)
Subjective:    Patient ID: Veronica Mullins, female    DOB: July 01, 1931, 78 y.o.   MRN: [id number]  HPI  This 78 y.o. female presents for evaluation of illness.  Returned from a cruise with her family and developed diarrhea on porting in [place] 11/05/2012.  They drove home 12/05 and she developed a cough.  As her cough progressed, the diarrhea resolved, and she presented to another facility on 12/07 where she was diagnosed with pneumonia.  She was treated with Levaquin 500 mg QD x 14 days, and Robitussin (minimal relief with this), and advised to follow-up there or here this week.  As she already had a visit scheduled here today for follow-up of her chronic issues, she elected to come here.    She feels weaker than she did 12/07, and has no appetite.  Working on eating and drinking to prevent dehydration.  The coughing is much improved, as is rhinorrhea.  Has "been in bed" since 12/07.  No fever/chills the entire course of illness thus far.  Mammogram is scheduled for next month.  Has had a big improvement in her "fluid" and is pleased.  Doesn't feel as swollen or bloated. Pain is adequately controlled with current treatment.  Wants to switch back to Cymbalta from Effexor.  She'd switched for cost, but notes the Effexor isn't as effective, even at the higher dose, and doesn't cost much less.  Past Medical History  Diagnosis Date  . Allergy   . Hypertension   . Varicose veins   . RLS (restless legs syndrome)   . Asthma   . B12 deficiency anemia   . History of GI bleed     colon polyps  . Colon polyps   . Osteopenia   . Vaginal stricture   . Vitamin D deficiency   . DDD (degenerative disc disease), cervical     causes numbness in left fingers, but hopes to avoid surgery    Past Surgical History  Procedure Date  . Tonsillectomy and adenoidectomy   . Tubal ligation   . Appendectomy   . Cataract   . Bilateral oophorectomy   . Salpingectomy     bilateral  . Carpal tunnel release 2013     left hand    Prior to Admission medications   Medication Sig Start Date End Date Taking? Authorizing Provider  albuterol (PROVENTIL HFA;VENTOLIN HFA) 108 (90 BASE) MCG/ACT inhaler Inhale 2 puffs into the lungs every 6 (six) hours as needed.   Yes Historical Provider, MD  alendronate (FOSAMAX) 70 MG tablet Take 70 mg by mouth every 7 (seven) days. Take with a full glass of water on an empty stomach.   Yes Historical Provider, MD  amitriptyline (ELAVIL) 100 MG tablet Take 0.5-1 tablets (50-100 mg total) by mouth at bedtime. 02/21/12 02/20/13 Yes Lyndal Alamillo S Jasimine Simms, PA-C  carbidopa-levodopa (SINEMET IR) 25-250 MG per tablet  06/06/12  Yes Historical Provider, MD  carvedilol (COREG) 6.25 MG tablet Take 1 tablet (6.25 mg total) by mouth 2 (two) times daily with a meal. 02/21/12  Yes Keylin Podolsky S Jarmal Lewelling, PA-C  esomeprazole (NEXIUM) 40 MG capsule Take 1 capsule (40 mg total) by mouth daily before breakfast. 02/21/12  Yes Wayland Baik S Joceline Hinchcliff, PA-C  Fluticasone-Salmeterol (ADVAIR DISKUS) 250-50 MCG/DOSE AEPB Inhale 1 puff into the lungs every 12 (twelve) hours.   Yes Historical Provider, MD  guaiFENesin (ROBITUSSIN) 100 MG/5ML liquid Take 200 mg by mouth 3 (three) times daily as needed.   Yes Historical Provider, MD  HYDROcodone-acetaminophen (  LORTAB) 10-500 MG per tablet TAKE 1 TABLET BY MOUTH EVERY 8 HOURS AS NEEDED FOR PAIN 09/15/12  Yes Morrell Riddle, PA-C  ipratropium (ATROVENT) 0.03 % nasal spray Place 2 sprays into the nose 2 (two) times daily. 06/26/12 06/26/13 Yes Aleia Larocca S Geroge Gilliam, PA-C  levofloxacin (LEVAQUIN) 500 MG tablet Take 500 mg by mouth daily.   Yes Historical Provider, MD  Rotigotine (NEUPRO) 3 MG/24HR PT24 Place onto the skin.   Yes Historical Provider, MD  spironolactone (ALDACTONE) 50 MG tablet Take 1 tablet (50 mg total) by mouth daily. 02/21/12  Yes Mayo Owczarzak S Shantanique Hodo, PA-C  venlafaxine XR (EFFEXOR XR) 150 MG 24 hr capsule Take 1 capsule (150 mg total) by mouth daily. 07/31/12 07/31/13 Yes Adalena Abdulla S  Renai Lopata, PA-C  vitamin B-12 (CYANOCOBALAMIN) 1000 MCG tablet Inject 1,000 mcg as directed every 30 (thirty) days.    Yes Historical Provider, MD    Allergies  Allergen Reactions  . Penicillins     Throat swells    History   Social History  . Marital Status: Married    Spouse Name: Alden Server    Number of Children: 2  . Years of Education: 14   Occupational History  . Retired    Social History Main Topics  . Smoking status: Never Smoker   . Smokeless tobacco: Never Used  . Alcohol Use: No  . Drug Use: No  . Sexually Active: No   Other Topics Concern  . Not on file   Social History Narrative   Lives with her husband.    Family History  Problem Relation Age of Onset  . Cancer Mother     breast  . Cancer Father     prostate    Review of Systems As above.  No chest pain, HA, dizziness, vision change, N/V, diarrhea, constipation, dysuria, urinary urgency or frequency, or rash.     Objective:   Physical Exam  Vitals reviewed. Constitutional: She is oriented to person, place, and time. Vital signs are normal. She appears well-developed and well-nourished. She is active and cooperative. No distress.       No makeup today-hair not done recently.  She looks tired.  HENT:  Head: Normocephalic and atraumatic.  Right Ear: Tympanic membrane, external ear and ear canal normal.  Left Ear: Tympanic membrane, external ear and ear canal normal.  Nose: Mucosal edema and rhinorrhea present. Right sinus exhibits no maxillary sinus tenderness and no frontal sinus tenderness. Left sinus exhibits no maxillary sinus tenderness and no frontal sinus tenderness.  Mouth/Throat: Uvula is midline, oropharynx is clear and moist and mucous membranes are normal.       Bilateral hearing aids removed for exam.  Eyes: Conjunctivae normal, EOM and lids are normal. Pupils are equal, round, and reactive to light. No scleral icterus.  Neck: Normal range of motion. Neck supple. No thyromegaly present.   Cardiovascular: Normal rate, regular rhythm and normal heart sounds.   Pulses:      Radial pulses are 2+ on the right side, and 2+ on the left side.       Dorsalis pedis pulses are 2+ on the right side, and 2+ on the left side.       Posterior tibial pulses are 2+ on the right side, and 2+ on the left side.  Pulmonary/Chest: Effort normal. No respiratory distress. She has wheezes (soft, end-expiratory throughout). She has rhonchi in the right upper field, the right middle field and the right lower field. She has no rales.  She exhibits no tenderness.  Lymphadenopathy:       Head (right side): No tonsillar, no preauricular, no posterior auricular and no occipital adenopathy present.       Head (left side): No tonsillar, no preauricular, no posterior auricular and no occipital adenopathy present.    She has no cervical adenopathy.       Right: No supraclavicular adenopathy present.       Left: No supraclavicular adenopathy present.  Neurological: She is alert and oriented to person, place, and time. No sensory deficit.  Skin: Skin is warm, dry and intact. No rash noted. No cyanosis or erythema. Nails show no clubbing.  Psychiatric: She has a normal mood and affect. Her behavior is normal. Judgment and thought content normal.      Assessment & Plan:   1. Cough secondary to pneumonia, improving Complete antibiotic therapy; Repeat CXR in 4 weeks; albuterol (PROVENTIL HFA;VENTOLIN HFA) 108 (90 BASE) MCG/ACT inhaler  2. Essential hypertension, benign -controlled spironolactone (ALDACTONE) 50 MG tablet, carvedilol (COREG) 6.25 MG tablet, Comprehensive metabolic panel, Lipid panel  3. Chronic pain -controlled HYDROcodone-acetaminophen (LORTAB) 10-500 MG per tablet  4. RHINITIS  ipratropium (ATROVENT) 0.03 % nasal spray  5. B12 deficiency anemia  Continue B12 injections  6. Osteopenia  alendronate (FOSAMAX) 70 MG tablet  7. Asthma  Fluticasone-Salmeterol (ADVAIR DISKUS) 250-50 MCG/DOSE AEPB,  albuterol (PROVENTIL HFA;VENTOLIN HFA) 108 (90 BASE) MCG/ACT inhaler  8. GERD (gastroesophageal reflux disease)  esomeprazole (NEXIUM) 40 MG capsule  9. Depression  amitriptyline (ELAVIL) 100 MG tablet, D/C Effexor and resume DULoxetine (CYMBALTA) 60 MG capsule  10. Hypothyroidism  TSH

## 2012-11-14 ENCOUNTER — Encounter: Payer: Self-pay | Admitting: Physician Assistant

## 2012-11-14 LAB — COMPREHENSIVE METABOLIC PANEL
ALT: 28 U/L (ref 0–35)
Albumin: 3.7 g/dL (ref 3.5–5.2)
CO2: 27 mEq/L (ref 19–32)
Glucose, Bld: 99 mg/dL (ref 70–99)
Potassium: 4.2 mEq/L (ref 3.5–5.3)
Sodium: 138 mEq/L (ref 135–145)
Total Protein: 6.5 g/dL (ref 6.0–8.3)

## 2012-11-14 LAB — LIPID PANEL
Cholesterol: 104 mg/dL (ref 0–200)
LDL Cholesterol: 41 mg/dL (ref 0–99)
Triglycerides: 165 mg/dL — ABNORMAL HIGH (ref <150)

## 2012-11-20 ENCOUNTER — Ambulatory Visit: Payer: Medicare Other | Admitting: Physician Assistant

## 2012-12-11 ENCOUNTER — Ambulatory Visit (INDEPENDENT_AMBULATORY_CARE_PROVIDER_SITE_OTHER): Payer: Medicare Other | Admitting: Physician Assistant

## 2012-12-11 ENCOUNTER — Encounter: Payer: Self-pay | Admitting: Physician Assistant

## 2012-12-11 VITALS — BP 124/60 | HR 75 | Temp 97.7°F | Resp 16 | Ht 64.5 in | Wt 164.8 lb

## 2012-12-11 DIAGNOSIS — K219 Gastro-esophageal reflux disease without esophagitis: Secondary | ICD-10-CM

## 2012-12-11 DIAGNOSIS — R05 Cough: Secondary | ICD-10-CM

## 2012-12-11 MED ORDER — OMEPRAZOLE 40 MG PO CPDR: 40.0000 mg | DELAYED_RELEASE_CAPSULE | Freq: Every day | ORAL | Status: DC

## 2012-12-11 MED ORDER — FLUTICASONE-SALMETEROL 500-50 MCG/DOSE IN AEPB: 1.0000 | INHALATION_SPRAY | Freq: Two times a day (BID) | RESPIRATORY_TRACT | Status: DC

## 2012-12-11 NOTE — Patient Instructions (Signed)
Use Mucinex Maximum Strength, 1200 mg, one tablet twice each day to keep the mucous thin and watery. Use the Advair Diskus 500/50 1 puff twice daily for 1 month.  Then go back to the Advair Diskus 250/50.  Remember to rinse your mouth out or brush you teeth after each dose.  Other medications for treatment of reflux: Omeprazole 20 mg, 40 mg (this is the generic name of Prilosec and can also be purchased over-the-counter at the 20 mg dose) Protonix (pantoprazole) 40 mg Aciphex (rabeprazole) 20 mg Prevacid (lansoprazole) 30 mg, 60 mg

## 2012-12-12 ENCOUNTER — Encounter: Payer: Self-pay | Admitting: *Deleted

## 2012-12-12 ENCOUNTER — Encounter: Payer: Self-pay | Admitting: Physician Assistant

## 2012-12-12 NOTE — Progress Notes (Signed)
  Subjective:    Patient ID: Veronica Mullins, female    DOB: August 08, 1931, 79 y.o.   MRN: [id number]  HPI This 79 y.o. female presents for evaluation of cough.  Since her visit 11/13/12, the other urgent care facility called her with the results of the CXR they ordered.  They advised her that she had a more serious infection than they initially thought and added Biaxin to her regimen of Levaquin.  In addition, she was given a guaifenisen cough syrup and a steroid dose-pack.  She is still not back to baseline, continues to cough, but feels much better than before.  No fever, chills, N/V/D.  No CP, HA, increased sinus pressure/congestion.  SOB only with coughing.  No LE edema.  Review of Systems As above.    Objective:   Physical Exam Blood pressure 124/60, pulse 75, temperature 97.7 F (36.5 C), temperature source Oral, resp. rate 16, height 5' 4.5" (1.638 m), weight 164 lb 12.8 oz (74.753 kg), SpO2 94.00%. Body mass index is 27.85 kg/(m^2). Well-developed, well nourished WF who is awake, alert and oriented, in NAD. HEENT: [place]/AT, PERRL, EOMI.  Sclera and conjunctiva are clear.  EAC are patent, TMs are normal in appearance. Nasal mucosa is pink and moist. OP is clear. Neck: supple, non-tender, no lymphadenopathy, thyromegaly. Heart: RRR, no murmur Lungs: normal effort, CTA except for an occasional wheeze. Extremities: no cyanosis, clubbing or edema. Skin: warm and dry without rash. Psychologic: good mood and appropriate affect, normal speech and behavior.     Assessment & Plan:   1. Cough  Increase Fluticasone-Salmeterol (ADVAIR) dose to 500-50 MCG/DOSE AEPB for the next month. Continue other medications.  Plan repeat CXR in 1 month.  2. GERD (gastroesophageal reflux disease)  D/C Nexium due to cost. omeprazole (PRILOSEC) 40 MG capsule, if effective, will continue on this lower cost alternative.  Consider a different preferred PPI if it inadequately controls her symptoms.

## 2013-01-15 ENCOUNTER — Ambulatory Visit: Payer: Medicare Other | Admitting: Physician Assistant

## 2013-02-05 ENCOUNTER — Other Ambulatory Visit: Payer: Self-pay | Admitting: Physician Assistant

## 2013-02-12 ENCOUNTER — Other Ambulatory Visit: Payer: Self-pay | Admitting: Physician Assistant

## 2013-02-19 ENCOUNTER — Ambulatory Visit (INDEPENDENT_AMBULATORY_CARE_PROVIDER_SITE_OTHER): Payer: Medicare Other | Admitting: Physician Assistant

## 2013-02-19 ENCOUNTER — Encounter: Payer: Self-pay | Admitting: Physician Assistant

## 2013-02-19 VITALS — BP 122/68 | HR 81 | Temp 98.1°F | Resp 16 | Ht 62.0 in | Wt 171.0 lb

## 2013-02-19 DIAGNOSIS — Z23 Encounter for immunization: Secondary | ICD-10-CM

## 2013-02-19 DIAGNOSIS — R059 Cough, unspecified: Secondary | ICD-10-CM

## 2013-02-19 DIAGNOSIS — R05 Cough: Secondary | ICD-10-CM

## 2013-02-19 DIAGNOSIS — K219 Gastro-esophageal reflux disease without esophagitis: Secondary | ICD-10-CM

## 2013-02-19 DIAGNOSIS — G8929 Other chronic pain: Secondary | ICD-10-CM

## 2013-02-19 DIAGNOSIS — I10 Essential (primary) hypertension: Secondary | ICD-10-CM

## 2013-02-19 LAB — PULMONARY FUNCTION TEST

## 2013-02-19 NOTE — Patient Instructions (Addendum)
If you have not heard anything regarding the referral in 10 days, please contact our office. I spoke with the pharmacist, and they have your hydrocodone prescription. Remember not to use both the hydrocodone (Lortab) AND the oxycodone (Percocet); use the oxycodone instead of the hydrocodone when you have seere pain.

## 2013-02-20 NOTE — Progress Notes (Signed)
Subjective:    Patient ID: Veronica Mullins, female    DOB: November 22, 1931, 79 y.o.   MRN: [id number]  HPI This 79 y.o. female presents for evaluation of chronic cough and wheezing.  Today she is ready for re-referral to pulmonology.  She has seen Dr. Craige Cotta previously.  She has a history of asthma and allergic rhinitis, and GERD.  She has been treated for these with steroid and anticholinergic nasal sprays, oral antihistamines, inhaled steroids and combination steroid + long acting beta agonist products, H2 blockers and PPIs.  Despite treatment, she continues to have frequent episodes of sinusitis and bronchitis, in addition to daily nasal congestions and drainage and cough. Cough is worse at night, and she's taken to sleeping propped up on several pillows. No increase in her leg edema.  Recall that several years ago we initiated treatment for possible CHF as a cause for these symptoms, but without improvement.  Today she doesn't feel bad, just is tired of the chronicity of her symptoms.   Past Medical History  Diagnosis Date  . Allergy   . Hypertension   . Varicose veins   . RLS (restless legs syndrome)   . Asthma   . B12 deficiency anemia   . History of GI bleed     colon polyps  . Colon polyps   . Osteopenia   . Vaginal stricture   . Vitamin D deficiency   . DDD (degenerative disc disease), cervical     causes numbness in left fingers, but hopes to avoid surgery    Past Surgical History  Procedure Laterality Date  . Tonsillectomy and adenoidectomy    . Tubal ligation    . Appendectomy    . Cataract    . Bilateral oophorectomy    . Salpingectomy      bilateral  . Carpal tunnel release  2013    left hand  . Abdominal hysterectomy  1991    Prior to Admission medications   Medication Sig Start Date End Date Taking? Authorizing Provider  albuterol (PROVENTIL HFA;VENTOLIN HFA) 108 (90 BASE) MCG/ACT inhaler Inhale 2 puffs into the lungs every 4 (four) hours as needed for wheezing or  shortness of breath (cough). 11/13/12  Yes Ysabel Cowgill S Moris Ratchford, PA-C  alendronate (FOSAMAX) 70 MG tablet Take 1 tablet (70 mg total) by mouth every 7 (seven) days. Take with a full glass of water on an empty stomach. 11/13/12  Yes Aloysious Vangieson S Jhaden Pizzuto, PA-C  amitriptyline (ELAVIL) 100 MG tablet Take 0.5-1 tablets (50-100 mg total) by mouth at bedtime. 11/13/12 11/13/13 Yes Wilda Wetherell S Justin Buechner, PA-C  carvedilol (COREG) 6.25 MG tablet Take 1 tablet (6.25 mg total) by mouth 2 (two) times daily with a meal. 11/13/12  Yes Hollister Wessler S Anuj Summons, PA-C  DULoxetine (CYMBALTA) 60 MG capsule Take 1 capsule (60 mg total) by mouth daily. 11/13/12  Yes Haedyn Ancrum S Gilbert Narain, PA-C  Fluticasone-Salmeterol (ADVAIR DISKUS) 250-50 MCG/DOSE AEPB Inhale 1 puff into the lungs every 12 (twelve) hours. 11/13/12  Yes Mantaj Chamberlin S Danett Palazzo, PA-C  Fluticasone-Salmeterol (ADVAIR) 500-50 MCG/DOSE AEPB Inhale 1 puff into the lungs 2 (two) times daily. 12/11/12  Yes Mili Piltz S Francies Inch, PA-C  HYDROcodone-acetaminophen (LORTAB) 10-500 MG per tablet TAKE 1 TABLET BY MOUTH EVERY 8 HOURS AS NEEDED FOR PAIN 02/12/13  Yes Geo Slone S Theon Sobotka, PA-C  ipratropium (ATROVENT) 0.03 % nasal spray Place 2 sprays into the nose 2 (two) times daily. 11/13/12 11/13/13 Yes Abriella Filkins S Burns Timson, PA-C  oxyCODONE-acetaminophen (PERCOCET) 10-325 MG per tablet Use in place  of hydrocodone for severe pain 01/13/13  Yes Historical Provider, MD  Rotigotine (NEUPRO) 3 MG/24HR PT24 Place onto the skin.   Yes Historical Provider, MD  spironolactone (ALDACTONE) 50 MG tablet Take 1 tablet (50 mg total) by mouth daily. 11/13/12  Yes Kariah Loredo S Destin Kittler, PA-C  vitamin B-12 (CYANOCOBALAMIN) 1000 MCG tablet Inject 1,000 mcg as directed every 30 (thirty) days.    Yes Historical Provider, MD  VITAMIN D, CHOLECALCIFEROL, PO Take by mouth daily.   Yes Historical Provider, MD    Allergies  Allergen Reactions  . Penicillins     Throat swells    History   Social History  . Marital Status: Married     Spouse Name: Alden Server    Number of Children: 2  . Years of Education: 14   Occupational History  . Retired    Social History Main Topics  . Smoking status: Never Smoker   . Smokeless tobacco: Never Used  . Alcohol Use: No  . Drug Use: No  . Sexually Active: No     Comment: not since her hysterectomy, due to dysareunia from scar tissue   Social History Narrative   Lives with her husband.    Family History  Problem Relation Age of Onset  . Cancer Mother     breast-double mastectomy  . Cancer Father     prostate and bone     Review of Systems Cough, wheezing, nasal congestion and drainage, as above.  No CP, HA, dizziness. Mild LE edema, her baseline. Back and leg pain is baseline.    Objective:   Physical Exam Blood pressure 122/68, pulse 81, temperature 98.1 F (36.7 C), temperature source Oral, resp. rate 16, height 5\' 2"  (1.575 m), weight 171 lb (77.565 kg), SpO2 94.00%. Body mass index is 31.27 kg/(m^2). Well-developed, well nourished WF who is awake, alert and oriented, in NAD. HEENT: [place]/AT, PERRL, EOMI.  Sclera and conjunctiva are clear.  EAC are patent, TMs are normal in appearance. Nasal mucosa is pink and moist. OP is clear. Neck: supple, non-tender, no lymphadenopathy, thyromegaly. Heart: RRR, no murmur Lungs: normal effort, CTA Abdomen: normo-active bowel sounds, supple, non-tender, no mass or organomegaly. Back: mild tenderness of the lumbago muscles.  No boney tenderness.  No pre-sacral edema. Extremities: no cyanosis, clubbing or edema. Skin: warm and dry without rash. Psychologic: good mood and appropriate affect, normal speech and behavior.  Office Spirometry Results: FEV1: 1.36 liters FVC: 1.97 liters FEV1/FVC: 69 % FVC  % Predicted: 83 liters FEV % Predicted: 77 liters FeF 25-75: 0.69 liters FeF 25-75 % Predicted: 52       Assessment & Plan:  Cough, asthma, allergic rhinitis, GERD - Plan: Ambulatory referral to Pulmonology, continue current  treatment pending any changes they make  GERD (gastroesophageal reflux disease) - continue the current OTC product that controls her symptoms  Essential hypertension, benign - controlled, continue current treatment  Chronic pain - continue current treatment.  Called pharmacy to inquire about 2 Rx authorizations-they have not received the Rx of 3/06, nor the re-attempt 3/14.  Order given by phone.  Need for Tdap vaccination - Plan: Tdap vaccine greater than or equal to 7yo IM  Fernande Bras, PA-C Certified Physician Assistant [facility]

## 2013-03-03 ENCOUNTER — Encounter: Payer: Self-pay | Admitting: Internal Medicine

## 2013-03-03 ENCOUNTER — Ambulatory Visit (INDEPENDENT_AMBULATORY_CARE_PROVIDER_SITE_OTHER): Payer: Medicare Other | Admitting: Internal Medicine

## 2013-03-03 VITALS — BP 110/70 | HR 81 | Temp 97.8°F | Ht 63.0 in | Wt 178.0 lb

## 2013-03-03 DIAGNOSIS — J45909 Unspecified asthma, uncomplicated: Secondary | ICD-10-CM

## 2013-03-03 DIAGNOSIS — R05 Cough: Secondary | ICD-10-CM

## 2013-03-03 MED ORDER — PREDNISONE (PAK) 10 MG PO TABS: ORAL_TABLET | ORAL | Status: DC

## 2013-03-03 MED ORDER — NEBIVOLOL HCL 10 MG PO TABS: ORAL_TABLET | ORAL | Status: DC

## 2013-03-03 MED ORDER — MOMETASONE FURO-FORMOTEROL FUM 100-5 MCG/ACT IN AERO: INHALATION_SPRAY | RESPIRATORY_TRACT | Status: DC

## 2013-03-03 NOTE — Progress Notes (Signed)
  Subjective:    Patient ID: Veronica Mullins, female    DOB: 1931/11/12  MRN: [id number]  HPI  33 yowf never smoker onset of cough and runny nose dx asthma / allergies around 1991 shots may have helped transisently but  only a better x a few weeks then recurred regardless of activity of weather then much worse x 6 months referred to [facility] clinic by Dr Tinnie Gens  03/03/2013 1st pulmonary eval/ [facility] cc severe cough daily x 6 months some better with prednisone Jan 2014 has problem lying down assoc with sense of pnds "after a while "  On coreg.  Doe x > slow adls correlates with cough. Some better with saba   No obvious daytime variabilty or assoc  cp or chest tightness, subjective wheeze overt sinus or hb symptoms. No unusual exp hx or h/o childhood pna/ asthma or premature birth to herknowledge.   Sleeping ok without nocturnal  or early am exacerbation  of respiratory  c/o's or need for noct saba. Also denies any obvious fluctuation of symptoms with weather or environmental changes or other aggravating or alleviating factors except as outlined above    Review of Systems  Constitutional: Negative for fever and unexpected weight change.  HENT: Positive for congestion, rhinorrhea and postnasal drip. Negative for ear pain, nosebleeds, sore throat, sneezing, trouble swallowing, dental problem and sinus pressure.   Eyes: Negative for redness and itching.  Respiratory: Positive for cough, shortness of breath and wheezing. Negative for chest tightness.   Cardiovascular: Negative for palpitations and leg swelling.  Gastrointestinal: Negative for nausea and vomiting.  Genitourinary: Negative for dysuria.  Musculoskeletal: Negative for joint swelling.  Skin: Negative for rash.  Neurological: Negative for headaches.  Hematological: Does not bruise/bleed easily.  Psychiatric/Behavioral: Negative for dysphoric mood. The patient is not nervous/anxious.        Objective:   Physical Exam  amb wf  nad Wt Readings from Last 3 Encounters:  03/03/13 178 lb (80.74 kg)  02/19/13 171 lb (77.565 kg)  12/11/12 164 lb 12.8 oz (74.753 kg)    HEENT: nl dentition, turbinates, and orophanx. Nl external ear canals without cough reflex   NECK :  without JVD/Nodes/TM/ nl carotid upstrokes bilaterally   LUNGS: no acc muscle use, clear to A and P bilaterally without cough on insp or exp maneuvers   CV:  RRR  no s3 or murmur or increase in P2, no edema   ABD:  soft and nontender with nl excursion in the supine position. No bruits or organomegaly, bowel sounds nl  MS:  warm without deformities, calf tenderness, cyanosis or clubbing  SKIN: warm and dry without lesions    NEURO:  alert, approp, no deficits        Assessment & Plan:

## 2013-03-03 NOTE — Patient Instructions (Addendum)
Stop advair, stop coreg   Bystolic 10 mg daily   Take pepcid ac 20 mg one at bedtime as long as coughing  Dulera 100 Take 2 puffs first thing in am and then another 2 puffs about 12 hours later.    Only use your albuterol (proaire) as a rescue medication to be used if you can't catch your breath by resting or doing a relaxed purse lip breathing pattern. The less you use it, the better it will work when you need it.   Prednisone 10 mg take  4 each am x 2 days,   2 each am x 2 days,  1 each am x2days and stop   GERD (REFLUX)  is an extremely common cause of respiratory symptoms, many times with no significant heartburn at all.    It can be treated with medication, but also with lifestyle changes including avoidance of late meals, excessive alcohol, smoking cessation, and avoid fatty foods, chocolate, peppermint, colas, red wine, and acidic juices such as orange juice.  NO MINT OR MENTHOL PRODUCTS SO NO COUGH DROPS  USE SUGARLESS CANDY INSTEAD (jolley ranchers or Stover's)  NO OIL BASED VITAMINS - use powdered substitutes.    Please schedule a follow up office visit in 4 weeks, sooner if needed

## 2013-03-04 NOTE — Assessment & Plan Note (Signed)
DDX of  difficult airways managment all start with A and  include Adherence, Ace Inhibitors, Acid Reflux, Active Sinus Disease, Alpha 1 Antitripsin deficiency, Anxiety masquerading as Airways dz,  ABPA,  allergy(esp in young), Aspiration (esp in elderly), Adverse effects of DPI,  Active smokers, plus two Bs  = Bronchiectasis and Beta blocker use..and one C= CHF   ? Acid reflux > rx ? Adverse effect of advair > change to hfa dulera ? Beta blocker effect > Strongly prefer in this setting: Bystolic, the most beta -1  selective Beta blocker available in sample form, with bisoprolol the most selective generic choice  on the market.

## 2013-03-15 ENCOUNTER — Other Ambulatory Visit: Payer: Self-pay | Admitting: Physician Assistant

## 2013-03-18 ENCOUNTER — Other Ambulatory Visit: Payer: Self-pay | Admitting: Physician Assistant

## 2013-04-05 ENCOUNTER — Other Ambulatory Visit: Payer: Self-pay | Admitting: Physician Assistant

## 2013-04-07 ENCOUNTER — Encounter: Payer: Self-pay | Admitting: Internal Medicine

## 2013-04-07 ENCOUNTER — Encounter: Payer: Self-pay | Admitting: Physician Assistant

## 2013-04-07 ENCOUNTER — Ambulatory Visit (INDEPENDENT_AMBULATORY_CARE_PROVIDER_SITE_OTHER): Payer: Medicare Other | Admitting: Internal Medicine

## 2013-04-07 VITALS — BP 102/62 | HR 66 | Temp 98.0°F | Ht 63.0 in | Wt 181.0 lb

## 2013-04-07 DIAGNOSIS — J45909 Unspecified asthma, uncomplicated: Secondary | ICD-10-CM

## 2013-04-07 DIAGNOSIS — R05 Cough: Secondary | ICD-10-CM

## 2013-04-07 DIAGNOSIS — I10 Essential (primary) hypertension: Secondary | ICD-10-CM

## 2013-04-07 NOTE — Progress Notes (Signed)
Subjective:    Patient ID: Veronica Mullins, female    DOB: 1931-03-22  MRN: [id number]  HPI  22 yowf never smoker onset of cough and runny nose dx asthma / allergies around 1991 shots may have helped transiently but  only a better x a few weeks then recurred regardless of activity or weather then much worse x 6 months referred to [facility] clinic by Dr Tinnie Gens.  03/03/2013 1st pulmonary eval/ [place] [facility] cc severe cough daily x 6 months some better with prednisone Jan 2014 has problem lying down assoc with sense of pnds "after a while "  On coreg.  Doe x > slow adls correlates with cough. Some better with saba. rec Stop advair, stop coreg  Bystolic 10 mg daily  Take pepcid ac 20 mg one at bedtime as long as coughing Dulera 100 Take 2 puffs first thing in am and then another 2 puffs about 12 hours later.  Only use your albuterol (proaire) as a rescue medication Prednisone 10 mg take  4 each am x 2 days,   2 each am x 2 days,  1 each am x2days and stop  GERD  Diet   04/07/2013 f/u ov/Leticia Mcdiarmid re cough not sure prednisone or any of above helped  Chief Complaint  Patient presents with  . Follow-up    pt stopped taking dulera and bystolic and went back to advair and coreg. pt states cough is not better.   relates stopped nexium in Dec 2013 due to cost, cough worse so restarted on 5/4 nexium 40 mg one hour before bast,  Cough is worse after goes to bed and back on advair, not using proaire much at all, not sure it helps. No cp, no excess mucus production    No obvious daytime variabilty or assoc chest tightness, subjective wheeze overt sinus or hb symptoms. No unusual exp hx or h/o childhood pna/ asthma or premature birth to her knowledge.   Sleeping ok without nocturnal  or early am exacerbation  of respiratory  c/o's or need for noct saba. Also denies any obvious fluctuation of symptoms with weather or environmental changes or other aggravating or alleviating factors except as outlined  above.  Current Medications, Allergies, Past Medical History, Past Surgical History, Family History, and Social History were reviewed in [employer] record.  ROS  The following are not active complaints unless bolded sore throat, dysphagia, dental problems, itching, sneezing,  nasal congestion or excess/ purulent secretions, ear ache,   fever, chills, sweats, unintended wt loss, pleuritic or exertional cp, hemoptysis,  orthopnea pnd or leg swelling, presyncope, palpitations, heartburn, abdominal pain, anorexia, nausea, vomiting, diarrhea  or change in bowel or urinary habits, change in stools or urine, dysuria,hematuria,  rash, arthralgias, visual complaints, headache, numbness weakness or ataxia or problems with walking or coordination,  change in mood/affect or memory.             Objective:   Physical Exam  amb wf nad 04/07/2013         181  Wt Readings from Last 3 Encounters:  03/03/13 178 lb (80.74 kg)  02/19/13 171 lb (77.565 kg)  12/11/12 164 lb 12.8 oz (74.753 kg)    HEENT: nl dentition, turbinates, and orophanx. Nl external ear canals without cough reflex   NECK :  without JVD/Nodes/TM/ nl carotid upstrokes bilaterally   LUNGS: no acc muscle use, insp and exp sonorous rhonchi bilaterally   CV:  RRR  no s3 or murmur or increase  in P2, no edema   ABD:  soft and nontender with nl excursion in the supine position. No bruits or organomegaly, bowel sounds nl  MS:  warm without deformities, calf tenderness, cyanosis or clubbing           Assessment & Plan:

## 2013-04-07 NOTE — Patient Instructions (Addendum)
Stop coreg and advair as they may aggravate  Your cough or wheeze Restart bystolic 5 mg one twice daily Continue nexium 40 mg Take 30-60 min before first meal of the day  and pecid 20 mg one at bedtime Symbicort 160 Take 2 puffs first thing in am and then another 2 puffs about 12 hours later.   Work on inhaler technique:  relax and gently blow all the way out then take a nice smooth deep breath back in, triggering the inhaler at same time you start breathing in.  Hold for up to 5 seconds if you can.  Rinse and gargle with water when done  Only use your albuterol (proaire) as a rescue medication to be used if you can't catch your breath by resting or doing a relaxed purse lip breathing pattern. The less you use it, the better it will work when you need it. Ok to use up to 2 puffs every 4 hours   For cough try delsym 2 tsp twice daily and supplement with hydrocodone up to every 4 hours if fine.  Follow up  2 weeks with all your medications, even over the counter meds, separated in two separate bags, the ones you take no matter what vs the ones you stop once you feel better and take only as needed when you feel you need them.

## 2013-04-08 NOTE — Assessment & Plan Note (Signed)
Strongly prefer in this setting: Bystolic, the most beta -1  selective Beta blocker available in sample form, with bisoprolol the most selective generic choice  on the market.   Restart bystolic 5 mg but take bid since this is how she was taking coreg

## 2013-04-08 NOTE — Assessment & Plan Note (Signed)
DDX of  difficult airways managment all start with A and  include Adherence, Ace Inhibitors, Acid Reflux, Active Sinus Disease, Alpha 1 Antitripsin deficiency, Anxiety masquerading as Airways dz,  ABPA,  allergy(esp in young), Aspiration (esp in elderly), Adverse effects of DPI,  Active smokers, plus two Bs  = Bronchiectasis and Beta blocker use..and one C= CHF   Adherence is always the initial "prime suspect" and is a multilayered concern that requires a "trust but verify" approach in every patient - starting with knowing how to use medications, especially inhalers, correctly, keeping up with refills and understanding the fundamental difference between maintenance and prns vs those medications only taken for a very short course and then stopped and not refilled.   ? Acid reflux > maintain rx  ? Allergy/ asthma > rx symbicort 160 2bid The proper method of use, as well as anticipated side effects, of a metered-dose inhaler are discussed and demonstrated to the patient. Improved effectiveness after extensive coaching during this visit to a level of approximately  75%

## 2013-04-08 NOTE — Assessment & Plan Note (Signed)
The most common causes of chronic cough in immunocompetent adults include the following: upper airway cough syndrome (UACS), previously referred to as postnasal drip syndrome (PNDS), which is caused by variety of rhinosinus conditions; (2) asthma; (3) GERD; (4) chronic bronchitis from cigarette smoking or other inhaled environmental irritants; (5) nonasthmatic eosinophilic bronchitis; and (6) bronchiectasis.   These conditions, singly or in combination, have accounted for up to 94% of the causes of chronic cough in prospective studies.   Other conditions have constituted no >6% of the causes in prospective studies These have included bronchogenic carcinoma, chronic interstitial pneumonia, sarcoidosis, left ventricular failure, ACEI-induced cough, and aspiration from a condition associated with pharyngeal dysfunction.    Chronic cough is often simultaneously caused by more than one condition. A single cause has been found from 38 to 82% of the time, multiple causes from 18 to 62%. Multiply caused cough has been the result of three diseases up to 42% of the time.     ? Cough variant asthma vs upper airway cough syndrome> The proper method of use, as well as anticipated side effects, of a metered-dose inhaler are discussed and demonstrated to the patient. Improved effectiveness after extensive coaching during this visit to a level of approximately  75% so try symbicort 160 2bid

## 2013-04-17 ENCOUNTER — Other Ambulatory Visit: Payer: Self-pay | Admitting: Physician Assistant

## 2013-04-21 ENCOUNTER — Encounter: Payer: Medicare Other | Admitting: Adult Health

## 2013-04-21 ENCOUNTER — Other Ambulatory Visit: Payer: Self-pay | Admitting: Radiology

## 2013-04-21 NOTE — Telephone Encounter (Signed)
Med refill only

## 2013-04-23 ENCOUNTER — Other Ambulatory Visit: Payer: Self-pay | Admitting: Physician Assistant

## 2013-05-28 ENCOUNTER — Ambulatory Visit: Payer: Self-pay | Admitting: Physician Assistant

## 2013-06-11 ENCOUNTER — Other Ambulatory Visit: Payer: Self-pay | Admitting: Physician Assistant

## 2013-06-25 ENCOUNTER — Encounter: Payer: Self-pay | Admitting: Physician Assistant

## 2013-06-25 ENCOUNTER — Ambulatory Visit (INDEPENDENT_AMBULATORY_CARE_PROVIDER_SITE_OTHER): Payer: Medicare Other | Admitting: Physician Assistant

## 2013-06-25 VITALS — BP 156/84 | HR 84 | Temp 99.3°F | Resp 16 | Ht 62.25 in | Wt 180.0 lb

## 2013-06-25 DIAGNOSIS — I10 Essential (primary) hypertension: Secondary | ICD-10-CM

## 2013-06-25 DIAGNOSIS — R350 Frequency of micturition: Secondary | ICD-10-CM

## 2013-06-25 DIAGNOSIS — R05 Cough: Secondary | ICD-10-CM

## 2013-06-25 DIAGNOSIS — G8929 Other chronic pain: Secondary | ICD-10-CM

## 2013-06-25 LAB — LIPID PANEL
HDL: 38 mg/dL — ABNORMAL LOW (ref >39)
LDL Cholesterol: 79 mg/dL (ref 0–99)
Total CHOL/HDL Ratio: 3.6 Ratio
Triglycerides: 98 mg/dL (ref <150)
VLDL: 20 mg/dL (ref 0–40)

## 2013-06-25 LAB — POCT UA - MICROSCOPIC ONLY
Bacteria, U Microscopic: NEGATIVE
Casts, Ur, LPF, POC: NEGATIVE
Crystals, Ur, HPF, POC: NEGATIVE
Mucus, UA: NEGATIVE

## 2013-06-25 LAB — COMPREHENSIVE METABOLIC PANEL
Alkaline Phosphatase: 65 U/L (ref 39–117)
BUN: 20 mg/dL (ref 6–23)
Creat: 0.88 mg/dL (ref 0.50–1.10)
Glucose, Bld: 89 mg/dL (ref 70–99)
Sodium: 137 mEq/L (ref 135–145)
Total Bilirubin: 0.8 mg/dL (ref 0.3–1.2)

## 2013-06-25 LAB — POCT URINALYSIS DIPSTICK
Blood, UA: NEGATIVE
Protein, UA: NEGATIVE
Spec Grav, UA: 1.02
Urobilinogen, UA: 0.2

## 2013-06-25 MED ORDER — HYDROCODONE-ACETAMINOPHEN 10-500 MG PO TABS: 1.0000 | ORAL_TABLET | Freq: Four times a day (QID) | ORAL | Status: DC | PRN

## 2013-06-25 MED ORDER — SPIRONOLACTONE 50 MG PO TABS: 75.0000 mg | ORAL_TABLET | Freq: Every day | ORAL | Status: DC

## 2013-06-25 NOTE — Progress Notes (Signed)
I directly supervised and participated in the procedure and agree with the student's documentation.  It is of note that Dr. Thurston Hole last note advises her to stop Advair and carvedilol and change to Symbicort and Bystolic.  The patient states that she did, temporarily, and "it was worse," so she went back to the Advair and carvedilol.

## 2013-06-25 NOTE — Patient Instructions (Signed)
I will contact you with your lab results as soon as they are available.   If you have not heard from me in 2 weeks, please contact me.  The fastest way to get your results is to register for My Chart (see the instructions on the last page of this printout).  Please continue follow-up with Dr. Sherene Sires.  Hopefully he can find a way to reduce your cough.

## 2013-06-25 NOTE — Progress Notes (Signed)
Subjective:    Patient ID: Veronica Mullins, female    DOB: 1931/05/20, 79 y.o.   MRN: [id number]  HPI 79 y.o female presents to clinic today for blood pressure recheck and medication refills. Pt has been having some increased urination for the past week. She says she is peeing 4-5 times per day and waking up at night to urinate. She attributes her increase in urination to drinking more water and lemonade and eating more watermelon. She does not have any urinary symptoms such as burning, pain, or itching. She is tolerating her medications well. She is having some more swelling in her legs than usual. She continues to have a cough and some wheezing but is working with Pulmonologist.    Review of Systems  Constitutional: Negative for chills, activity change, appetite change and fatigue.  Respiratory: Positive for cough and wheezing.   Cardiovascular: Positive for leg swelling. Negative for chest pain and palpitations.  Gastrointestinal: Negative for nausea, vomiting, abdominal pain and diarrhea.  Endocrine: Positive for polyuria.  Genitourinary: Positive for frequency and enuresis. Negative for dysuria, urgency and hematuria.  Musculoskeletal: Positive for back pain.  Skin: Negative for pallor and rash.  Neurological: Negative for dizziness, light-headedness and headaches.  All other systems reviewed and are negative.      Objective:   Physical Exam  Nursing note reviewed. Constitutional: She is oriented to person, place, and time. Vital signs are normal. She appears well-developed and well-nourished. No distress.  HENT:  Head: Normocephalic and atraumatic.  Eyes: Conjunctivae are normal.  Neck: Normal range of motion. Neck supple.  Cardiovascular: Normal rate, regular rhythm, normal heart sounds and intact distal pulses.   Pulmonary/Chest: Effort normal and breath sounds normal. She has no wheezes.  Musculoskeletal: Normal range of motion.       Right lower leg: She exhibits edema (1+).        Left lower leg: She exhibits edema (1+).  Neurological: She is alert and oriented to person, place, and time.  Skin: Skin is warm, dry and intact. No rash noted. She is not diaphoretic.  Psychiatric: She has a normal mood and affect. Her behavior is normal.       Results for orders placed in visit on 06/25/13  POCT UA - MICROSCOPIC ONLY      Result Value Range   WBC, Ur, HPF, POC 0-4     RBC, urine, microscopic 0-1     Bacteria, U Microscopic neg     Mucus, UA neg     Epithelial cells, urine per micros 0-4     Crystals, Ur, HPF, POC neg     Casts, Ur, LPF, POC neg     Yeast, UA neg    POCT URINALYSIS DIPSTICK      Result Value Range   Color, UA yellow     Clarity, UA clear     Glucose, UA neg     Bilirubin, UA neg     Ketones, UA neg     Spec Grav, UA 1.020     Blood, UA neg     pH, UA 7.5     Protein, UA neg     Urobilinogen, UA 0.2     Nitrite, UA neg     Leukocytes, UA Trace     \  Assessment & Plan:  Urinary frequency - Plan: POCT UA - Microscopic Only, POCT urinalysis dipstick. U/A showed no signs of infection that could be causing her urinary frequency. Discussed with patient that it  may be due to increased fluid intake and eating foods with high water content especially late at night. Instructed her to try and reduce fluids before bed time.   Cough- Patient has chronic cough. She currently is seeing a pulmonologist. Should continue treatment as directed.   Essential hypertension, benign - Plan: Comprehensive metabolic panel, Lipid panel, spironolactone (ALDACTONE) 50 MG tablet. Patient having lower extremity edema so will increase her Spironolactone to 75 mg po QD.  Chronic pain - Plan: HYDROcodone-acetaminophen (LORTAB) 10-500 MG per tablet.   Patient still having pain on [facility] and has tried taking additional Tylenol will little relief   of pain so she requested to be placed back on LORTAB.

## 2013-06-30 ENCOUNTER — Other Ambulatory Visit: Payer: Self-pay | Admitting: Physician Assistant

## 2013-06-30 MED ORDER — CYCLOBENZAPRINE HCL 10 MG PO TABS: 5.0000 mg | ORAL_TABLET | Freq: Every evening | ORAL | Status: DC | PRN

## 2013-07-07 ENCOUNTER — Encounter: Payer: Self-pay | Admitting: Physician Assistant

## 2013-07-08 ENCOUNTER — Other Ambulatory Visit: Payer: Self-pay | Admitting: Physician Assistant

## 2013-07-08 MED ORDER — CARISOPRODOL 250 MG PO TABS: 250.0000 mg | ORAL_TABLET | Freq: Every evening | ORAL | Status: DC | PRN

## 2013-07-09 ENCOUNTER — Encounter: Payer: Self-pay | Admitting: Physician Assistant

## 2013-07-17 ENCOUNTER — Encounter: Payer: Self-pay | Admitting: Physician Assistant

## 2013-07-27 ENCOUNTER — Other Ambulatory Visit: Payer: Self-pay | Admitting: Physician Assistant

## 2013-08-23 ENCOUNTER — Other Ambulatory Visit: Payer: Self-pay | Admitting: Physician Assistant

## 2013-08-23 ENCOUNTER — Encounter: Payer: Self-pay | Admitting: Physician Assistant

## 2013-08-24 ENCOUNTER — Other Ambulatory Visit: Payer: Self-pay | Admitting: Physician Assistant

## 2013-08-24 MED ORDER — CARISOPRODOL 350 MG PO TABS: 350.0000 mg | ORAL_TABLET | Freq: Every evening | ORAL | Status: DC | PRN

## 2013-08-26 ENCOUNTER — Encounter: Payer: Self-pay | Admitting: Physician Assistant

## 2013-09-07 ENCOUNTER — Other Ambulatory Visit: Payer: Self-pay | Admitting: Physician Assistant

## 2013-09-09 MED ORDER — HYDROCODONE-ACETAMINOPHEN 10-325 MG PO TABS: ORAL_TABLET | ORAL | Status: DC

## 2013-09-15 ENCOUNTER — Encounter: Payer: Self-pay | Admitting: Physician Assistant

## 2013-09-15 NOTE — Telephone Encounter (Signed)
I do not see any documentation it was mailed. It was printed, and you signed it. Unfortunately , I have nothing else documented. Do you want to reprint and ask her to pick up? Or allow more time, if it was mailed? I did not mail it.

## 2013-09-17 ENCOUNTER — Encounter: Payer: Self-pay | Admitting: Physician Assistant

## 2013-09-17 NOTE — Telephone Encounter (Signed)
Still unclear who mailed this, we really should be documenting this.

## 2013-09-24 ENCOUNTER — Encounter: Payer: Self-pay | Admitting: Physician Assistant

## 2013-09-24 ENCOUNTER — Ambulatory Visit (INDEPENDENT_AMBULATORY_CARE_PROVIDER_SITE_OTHER): Payer: Medicare Other | Admitting: Physician Assistant

## 2013-09-24 VITALS — BP 130/66 | HR 74 | Temp 97.5°F | Resp 16 | Ht 62.5 in | Wt 182.0 lb

## 2013-09-24 DIAGNOSIS — R05 Cough: Secondary | ICD-10-CM

## 2013-09-24 DIAGNOSIS — G8929 Other chronic pain: Secondary | ICD-10-CM

## 2013-09-24 DIAGNOSIS — J4541 Moderate persistent asthma with (acute) exacerbation: Secondary | ICD-10-CM

## 2013-09-24 DIAGNOSIS — J45909 Unspecified asthma, uncomplicated: Secondary | ICD-10-CM

## 2013-09-24 DIAGNOSIS — R059 Cough, unspecified: Secondary | ICD-10-CM

## 2013-09-24 DIAGNOSIS — K5900 Constipation, unspecified: Secondary | ICD-10-CM

## 2013-09-24 DIAGNOSIS — E039 Hypothyroidism, unspecified: Secondary | ICD-10-CM

## 2013-09-24 MED ORDER — DOCUSATE SODIUM 100 MG PO CAPS: 100.0000 mg | ORAL_CAPSULE | Freq: Two times a day (BID) | ORAL | Status: DC

## 2013-09-24 MED ORDER — HYDROCODONE-ACETAMINOPHEN 10-325 MG PO TABS: ORAL_TABLET | ORAL | Status: DC

## 2013-09-24 NOTE — Patient Instructions (Signed)
Consider joining the local [facility], and sign up for the water aerobics classes. Reduce the carbohydrates that you eat.

## 2013-09-24 NOTE — Progress Notes (Signed)
  Subjective:    Patient ID: Veronica Mullins, female    DOB: Apr 04, 1931, 79 y.o.   MRN: [id number]  HPI This 79 y.o. female presents for evaluation of her chronic medical problems.    Patient Active Problem List   Diagnosis Date Noted  . Constipation 09/24/2013  . Asthma 11/13/2012  . GERD (gastroesophageal reflux disease) 11/13/2012  . Depression 11/13/2012  . Hypothyroidism 11/13/2012  . Essential hypertension, benign 02/21/2012  . Chronic pain 02/21/2012  . Varicose veins   . B12 deficiency anemia   . Osteopenia   . DIVERTICULOSIS OF COLON 02/23/2009  . ABNORMAL WEIGHT GAIN 02/23/2009  . GI BLEEDING 02/22/2009  . DYSPNEA 03/23/2008  . RESTLESS LEG SYNDROME 03/04/2008  . RHINITIS 03/04/2008  . Cough 03/04/2008   Overall, she's doing better than recent previous visits.    Her breathing is better and she reports less coughing since switching from Advair to [facility].  She still uses the albuterol inhaler PRN.  Soma was ineffective for sleep. Has restarted Cymbalta, and notes that it really does help her mood.  Is frustrated with weight gain over the past year.  No exercising due to her arthritis pain. "I know what it is, it's bread."  Very little sweets.  She hasn't filled her last Rx for hydrocodone.  It was mailed to her, and then the local pharmacies in [place], [place] have been out.  She meant to bring the Rx with her to [facility] today for her visit so she could try pharmacies here, but forgot it.  Medications, allergies, past medical history, surgical history, family history, social history and problem list reviewed.   Review of Systems No chest pain, HA, dizziness, vision change, N/V, diarrhea, constipation, dysuria, urinary urgency or frequency, or rash. LE edema with long days.  Resolves overnight and with elevation.  Doesn't want to wear compression stockings.     Objective:   Physical Exam Blood pressure 130/66, pulse 74, temperature 97.5 F (36.4 C), temperature  source Oral, resp. rate 16, height 5' 2.5" (1.588 m), weight 182 lb (82.555 kg), SpO2 94.00%. Body mass index is 32.74 kg/(m^2). Well-developed, well nourished WF who is awake, alert and oriented, in NAD. HEENT: [place]/AT, PERRL, EOMI.  Sclera and conjunctiva are clear.  EAC are patent, TMs are normal in appearance. Nasal mucosa is pink and moist. OP is clear. Neck: supple, non-tender, no lymphadenopathy, thyromegaly. Heart: RRR, no murmur Lungs: normal effort, CTA. No wheezing. Extremities: no cyanosis, clubbing or edema. Skin: warm and dry without rash. Psychologic: good mood and appropriate affect, normal speech and behavior.        Assessment & Plan:  Chronic pain - Plan: HYDROcodone-acetaminophen (NORCO) 10-325 MG per tablet; Rx reprinted so that she may get it filled in [place] before heading back to [place], [place].  Asthma/Cough - improved.  Continue Dulera and PRN albuterol.  Constipation - Plan: docusate sodium (COLACE) 100 MG capsule  Continue other medications as before.  Encouraged healthy eating and exercise-swimming or stationary bike.  Fernande Bras, PA-C Physician Assistant-Certified [facility]

## 2013-11-08 ENCOUNTER — Telehealth: Payer: Self-pay | Admitting: Physician Assistant

## 2013-11-09 MED ORDER — CARISOPRODOL 350 MG PO TABS: 350.0000 mg | ORAL_TABLET | Freq: Every evening | ORAL | Status: DC | PRN

## 2013-11-09 NOTE — Telephone Encounter (Signed)
Please phone in: Meds ordered this encounter  Medications  . carisoprodol (SOMA) 350 MG tablet    Sig: Take 1 tablet (350 mg total) by mouth at bedtime as needed.    Dispense:  30 tablet    Refill:  0    Order Specific Question:  Supervising Provider    Answer:  DOOLITTLE, ROBERT P [[id number]]

## 2013-11-10 NOTE — Telephone Encounter (Signed)
Called in/ patient in office now.

## 2013-11-12 LAB — BASIC METABOLIC PANEL WITH GFR
BUN: 27 mg/dL — AB (ref 4–21)
Creatinine: 0.8 mg/dL (ref <=1.1)
Potassium: 4.3 mmol/L (ref 3.4–5.3)
Sodium: 138 mmol/L (ref 137–147)

## 2013-11-12 LAB — HEPATIC FUNCTION PANEL
ALT: 12 U/L (ref 7–35)
AST: 19 U/L (ref 13–35)
Alkaline Phosphatase: 87 U/L (ref 25–125)
Bilirubin, Total: 0.5 mg/dL

## 2013-11-12 LAB — CBC AND DIFFERENTIAL
HCT: 43 % (ref 36–46)
Hemoglobin: 13.6 g/dL (ref 12.0–16.0)
Neutrophils Absolute: 5 /uL
Platelets: 203 K/µL (ref 150–399)
WBC: 7.8 10*3/mL

## 2013-11-15 ENCOUNTER — Other Ambulatory Visit: Payer: Self-pay | Admitting: Physician Assistant

## 2013-11-16 NOTE — Telephone Encounter (Signed)
A PA was required for coverage and after consulting w/Chelle, I sent the PA info through covermymeds. Today I received a denial of coverage d/t Rx not being Rxd for a medically accepted reason. I have put the denial in Chelle's box for review.

## 2013-11-17 ENCOUNTER — Encounter: Payer: Self-pay | Admitting: Family Medicine

## 2013-11-19 LAB — BASIC METABOLIC PANEL: GLUCOSE: 94 mg/dL

## 2013-11-30 ENCOUNTER — Telehealth: Payer: Self-pay

## 2013-11-30 NOTE — Telephone Encounter (Signed)
PA was denied for Soma. Called pt and she stated that she doesn't think it is that expensive and will pay out of pocket. She will CB if cost is high to try something else.

## 2013-12-22 ENCOUNTER — Encounter: Payer: Self-pay | Admitting: Physician Assistant

## 2013-12-22 DIAGNOSIS — G8929 Other chronic pain: Secondary | ICD-10-CM

## 2013-12-23 MED ORDER — HYDROCODONE-ACETAMINOPHEN 10-325 MG PO TABS: ORAL_TABLET | ORAL | Status: DC

## 2013-12-23 NOTE — Telephone Encounter (Signed)
Rx printed. Please mail to patient per her request (via My Chart).

## 2013-12-31 ENCOUNTER — Encounter: Payer: Self-pay | Admitting: Physician Assistant

## 2014-01-04 ENCOUNTER — Other Ambulatory Visit: Payer: Self-pay | Admitting: Physician Assistant

## 2014-01-04 LAB — HM MAMMOGRAPHY

## 2014-01-25 ENCOUNTER — Other Ambulatory Visit: Payer: Self-pay | Admitting: Physician Assistant

## 2014-01-28 ENCOUNTER — Ambulatory Visit: Payer: Medicare Other | Admitting: Physician Assistant

## 2014-02-11 ENCOUNTER — Ambulatory Visit (INDEPENDENT_AMBULATORY_CARE_PROVIDER_SITE_OTHER): Payer: Medicare Other | Admitting: Physician Assistant

## 2014-02-11 ENCOUNTER — Encounter: Payer: Self-pay | Admitting: Physician Assistant

## 2014-02-11 VITALS — BP 148/73 | HR 79 | Temp 99.1°F | Resp 16 | Ht 63.0 in | Wt 196.0 lb

## 2014-02-11 DIAGNOSIS — R635 Abnormal weight gain: Secondary | ICD-10-CM

## 2014-02-11 DIAGNOSIS — E039 Hypothyroidism, unspecified: Secondary | ICD-10-CM

## 2014-02-11 DIAGNOSIS — J189 Pneumonia, unspecified organism: Secondary | ICD-10-CM

## 2014-02-11 DIAGNOSIS — I10 Essential (primary) hypertension: Secondary | ICD-10-CM

## 2014-02-11 DIAGNOSIS — G8929 Other chronic pain: Secondary | ICD-10-CM

## 2014-02-11 MED ORDER — HYDROCODONE-ACETAMINOPHEN 10-325 MG PO TABS: ORAL_TABLET | ORAL | Status: DC

## 2014-02-11 NOTE — Progress Notes (Signed)
   Subjective:    Patient ID: Veronica Mullins, female    DOB: Oct 28, 1931, 80 y.o.   MRN: [id number]   PCP: Taryll Reichenberger, PA-C  Chief Complaint  Patient presents with  . Follow-up    BP  . Follow-up    Dx with pneumonia on Sunday  . Fatigue    Medications, allergies, past medical history, surgical history, family history, social history and problem list reviewed and updated.  HPI  RLL [facility].  Given steroid injection, oral steroids and Levaquin. Tired, short winded, but improving.  Gaining weight. 14 pounds since 09/2013. Generally eats well (nothing fried, few meats), but likes bread, and snacks on peanuts in the evenings.  Unable to exercise due to arthritis (and she doesn't swim).  Needs some dental work-root canal, gum surgery, crown replacement ($01-2999) vs. Dental extraction of the involved tooth.  Review of Systems As above. No CP, HA, Dizziness, N/V/Diarrhea. No new joint or muscle pain.    Objective:   Physical Exam Blood pressure 148/73, pulse 79, temperature 99.1 F (37.3 C), resp. rate 16, height 5\' 3"  (1.6 m), weight 196 lb (88.905 kg), SpO2 94.00%. Body mass index is 34.73 kg/(m^2). Well-developed, well nourished WF who is awake, alert and oriented, in NAD. HEENT: [place]/AT, PERRL, EOMI.  Sclera and conjunctiva are clear.  EAC are patent, TMs are normal in appearance. Nasal mucosa is pink and moist. OP is clear. Neck: supple, non-tender, no lymphadenopathy, thyromegaly. Heart: RRR, no murmur Lungs: normal effort, wheezes, rhonchi bilaterally, R>L Extremities: no cyanosis, clubbing or edema. Skin: warm and dry without rash. Psychologic: good mood and appropriate affect, normal speech and behavior.       Assessment & Plan:  1. CAP (community acquired pneumonia) Continue and complete treatment.  Advise repeat CXR in 4 weeks to verify clearance.  2. Essential hypertension, benign A little above goal today, no  changes.  3. Chronic pain Stable. Continue current treatment. - HYDROcodone-acetaminophen (NORCO) 10-325 MG per tablet; TAKE 1 TABLET BY MOUTH EVERY 8 HOURS AS NEEDED FOR PAIN  Dispense: 90 tablet; Refill: 0  4. Hypothyroidism Update labs - TSH  5. Weight gain Difficult that she can't do much exercise.   Fernande Brashelle S. Keshara Kiger, PA-C Physician Assistant-Certified [facility]

## 2014-02-12 ENCOUNTER — Encounter: Payer: Self-pay | Admitting: *Deleted

## 2014-02-12 LAB — TSH: TSH: 1.501 u[IU]/mL (ref 0.350–4.500)

## 2014-03-11 ENCOUNTER — Ambulatory Visit (INDEPENDENT_AMBULATORY_CARE_PROVIDER_SITE_OTHER): Payer: Medicare Other | Admitting: Physician Assistant

## 2014-03-11 ENCOUNTER — Encounter: Payer: Self-pay | Admitting: Physician Assistant

## 2014-03-11 ENCOUNTER — Ambulatory Visit: Payer: Medicare Other

## 2014-03-11 VITALS — BP 130/62 | HR 63 | Temp 98.4°F | Resp 16 | Ht 63.0 in | Wt 199.8 lb

## 2014-03-11 DIAGNOSIS — F329 Major depressive disorder, single episode, unspecified: Secondary | ICD-10-CM

## 2014-03-11 DIAGNOSIS — J189 Pneumonia, unspecified organism: Secondary | ICD-10-CM

## 2014-03-11 DIAGNOSIS — J45909 Unspecified asthma, uncomplicated: Secondary | ICD-10-CM

## 2014-03-11 DIAGNOSIS — F32A Depression, unspecified: Secondary | ICD-10-CM

## 2014-03-11 DIAGNOSIS — G8929 Other chronic pain: Secondary | ICD-10-CM

## 2014-03-11 DIAGNOSIS — F3289 Other specified depressive episodes: Secondary | ICD-10-CM

## 2014-03-11 MED ORDER — MOMETASONE FURO-FORMOTEROL FUM 200-5 MCG/ACT IN AERO: 2.0000 | INHALATION_SPRAY | Freq: Two times a day (BID) | RESPIRATORY_TRACT | Status: DC

## 2014-03-11 MED ORDER — OXYCODONE-ACETAMINOPHEN 10-325 MG PO TABS
1.0000 | ORAL_TABLET | Freq: Four times a day (QID) | ORAL | Status: DC | PRN
Start: 2014-03-11 — End: 2014-06-10

## 2014-03-11 MED ORDER — METHYLPREDNISOLONE SODIUM SUCC 125 MG IJ SOLR
125.0000 mg | Freq: Once | INTRAMUSCULAR | Status: AC
  Administered 2014-03-11: 125 mg via INTRAMUSCULAR

## 2014-03-11 MED ORDER — AMITRIPTYLINE HCL 100 MG PO TABS: 50.0000 mg | ORAL_TABLET | Freq: Every day | ORAL | Status: DC

## 2014-03-11 NOTE — Progress Notes (Signed)
Subjective:    Patient ID: Veronica Mullins, female    DOB: 11/08/1931, 80 y.o.   MRN: [id number]   PCP: Keairra Bardon, PA-C  Chief Complaint  Patient presents with  . Follow-up    PNEUMONIA    Medications, allergies, past medical history, surgical history, family history, social history and problem list reviewed and updated.  HPI Patient presents for follow-up CXR 4 weeks after diagnosis of CAP at another facility.  She completed Levaquin without difficulty. Continues to wheeze, but feels better. Now on Clindamycin after a dental extraction 2 days ago.  Review of Systems No CP, SOB, HA, Dizziness, Nausea, vomiting, diarrhea, constipation.    Objective:   Physical Exam  Vitals reviewed. Constitutional: She is oriented to person, place, and time. Vital signs are normal. She appears well-developed and well-nourished. She is active and cooperative. No distress.  BP 130/62  Pulse 63  Temp(Src) 98.4 F (36.9 C) (Oral)  Resp 16  Ht 5\' 3"  (1.6 m)  Wt 199 lb 12.8 oz (90.629 kg)  BMI 35.40 kg/m2  SpO2 90%  HENT:  Head: Normocephalic and atraumatic.  Right Ear: Hearing normal.  Left Ear: Hearing normal.  Eyes: Conjunctivae are normal. No scleral icterus.  Neck: Normal range of motion. Neck supple. No thyromegaly present.  Cardiovascular: Normal rate, regular rhythm and normal heart sounds.   Pulses:      Radial pulses are 2+ on the right side, and 2+ on the left side.  Pulmonary/Chest: Effort normal. She has wheezes (diffuse expiratory ) in the right upper field, the right middle field, the right lower field, the left upper field, the left middle field and the left lower field. She has no rhonchi. She has no rales.  Lymphadenopathy:       Head (right side): No tonsillar, no preauricular, no posterior auricular and no occipital adenopathy present.       Head (left side): No tonsillar, no preauricular, no posterior auricular and no occipital adenopathy present.    She has no  cervical adenopathy.       Right: No supraclavicular adenopathy present.       Left: No supraclavicular adenopathy present.  Neurological: She is alert and oriented to person, place, and time. No sensory deficit.  Skin: Skin is warm, dry and intact. No rash noted. No cyanosis or erythema. Nails show no clubbing.  Psychiatric: She has a normal mood and affect.      CXR: [facility] reading (PRIMARY) by  Dr. Milus Glazier. Density noted LLL, seen best on lateral view.      Assessment & Plan:  1. CAP (community acquired pneumonia) 2. Asthma Concern for persistent infiltrate.  Await over-read.  If post-infection inflammatory changes, steroid injection today should resolve it.  If not, plan CT scan. - DG Chest 2 View; Future - mometasone-formoterol (DULERA) 200-5 MCG/ACT AERO; Inhale 2 puffs into the lungs 2 (two) times daily.  Dispense: 13 g; Refill: 5 - methylPREDNISolone sodium succinate (SOLU-MEDROL) 125 mg/2 mL injection 125 mg; Inject 2 mLs (125 mg total) into the muscle once.  3. Chronic pain She uses hydrocodone most of the time for this, but switches to oxycodone temporarily for more severe pain. - oxyCODONE-acetaminophen (PERCOCET) 10-325 MG per tablet; Take 1 tablet by mouth every 6 (six) hours as needed (pain not relieved by hydrocodone).  Dispense: 30 tablet; Refill: 0  4. Depression Stable. Continue current treatment. - amitriptyline (ELAVIL) 100 MG tablet; Take 0.5-1 tablets (50-100 mg total) by mouth at bedtime.  Dispense:  90 tablet; Refill: 4  Return for re-evaluation in 10-14 days.  Fernande Brashelle S. Akilah Cureton, PA-C Physician Assistant-Certified [facility]

## 2014-03-23 ENCOUNTER — Encounter: Payer: Self-pay | Admitting: Physician Assistant

## 2014-03-23 ENCOUNTER — Ambulatory Visit (INDEPENDENT_AMBULATORY_CARE_PROVIDER_SITE_OTHER): Payer: Medicare Other | Admitting: Physician Assistant

## 2014-03-23 VITALS — BP 104/56 | HR 73 | Temp 98.9°F | Resp 16 | Ht 62.5 in | Wt 197.6 lb

## 2014-03-23 DIAGNOSIS — J189 Pneumonia, unspecified organism: Secondary | ICD-10-CM

## 2014-03-23 NOTE — Progress Notes (Signed)
   Subjective:    Patient ID: Veronica Mullins, female    DOB: 04/27/1931, 80 y.o.   MRN: [id number]   PCP: Raykwon Hobbs, PA-C  Chief Complaint  Patient presents with  . Follow-up    pneumonia.  feeling some better    HPI  She presents for follow-up of recent pneumonia.  She's continuing to improve after treatment with Levaquin, followed by a dose of steroid due to continued wheezing. Still not quite back to normal, but "close."  4/09 CXR concern for LLL density.  Overread The heart size is enlarged. The lung volumes are low. There is  atelectasis noted in both lung bases. No pleural effusion or edema identified. Mild degenerative disc disease noted within the thoracic spine.   Review of Systems No CP, HA, dizziness, nausea, vomiting or diarrhea. No new myalgias, arthralgias.  No rash.    Objective:   Physical Exam  Vitals reviewed. Constitutional: She is oriented to person, place, and time. Vital signs are normal. She appears well-developed and well-nourished. She is active and cooperative. No distress.  BP 104/56  Pulse 73  Temp(Src) 98.9 F (37.2 C) (Oral)  Resp 16  Ht 5' 2.5" (1.588 m)  Wt 197 lb 9.6 oz (89.631 kg)  BMI 35.54 kg/m2  SpO2 92%  HENT:  Head: Normocephalic and atraumatic.  Right Ear: Hearing normal.  Left Ear: Hearing normal.  Eyes: Conjunctivae are normal. No scleral icterus.  Neck: Normal range of motion. Neck supple. No thyromegaly present.  Cardiovascular: Normal rate, regular rhythm and normal heart sounds.   Pulses:      Radial pulses are 2+ on the right side, and 2+ on the left side.  Pulmonary/Chest: Effort normal. She has no decreased breath sounds. She has wheezes (diffuse, soft). She has no rhonchi. She has no rales.  Lymphadenopathy:       Head (right side): No tonsillar, no preauricular, no posterior auricular and no occipital adenopathy present.       Head (left side): No tonsillar, no preauricular, no posterior auricular and no occipital  adenopathy present.    She has no cervical adenopathy.       Right: No supraclavicular adenopathy present.       Left: No supraclavicular adenopathy present.  Neurological: She is alert and oriented to person, place, and time. No sensory deficit.  Skin: Skin is warm, dry and intact. No rash noted. No cyanosis or erythema. Nails show no clubbing.  Psychiatric: She has a normal mood and affect.          Assessment & Plan:  1. CAP (community acquired pneumonia) Infection resolved.  Continue current treatment with Dulera and prn albuterol.  Return in about 3 months (around 06/22/2014)., sooner if needed.  Fernande Brashelle S. Johnetta Sloniker, PA-C Physician Assistant-Certified [facility]

## 2014-03-23 NOTE — Patient Instructions (Signed)
Keep up the great work!

## 2014-04-12 ENCOUNTER — Other Ambulatory Visit: Payer: Self-pay | Admitting: Physician Assistant

## 2014-04-13 NOTE — Telephone Encounter (Signed)
Chelle, you have seen pt recently for some other chronic issues, but not for GERD. Do you want to give RFs or RTC?

## 2014-04-24 ENCOUNTER — Other Ambulatory Visit: Payer: Self-pay | Admitting: Physician Assistant

## 2014-05-10 ENCOUNTER — Other Ambulatory Visit: Payer: Self-pay | Admitting: Physician Assistant

## 2014-06-10 ENCOUNTER — Encounter: Payer: Self-pay | Admitting: Physician Assistant

## 2014-06-10 ENCOUNTER — Ambulatory Visit (INDEPENDENT_AMBULATORY_CARE_PROVIDER_SITE_OTHER): Payer: Medicare Other | Admitting: Physician Assistant

## 2014-06-10 VITALS — BP 120/70 | HR 82 | Temp 98.7°F | Resp 16 | Ht 63.0 in | Wt 190.0 lb

## 2014-06-10 DIAGNOSIS — F32A Depression, unspecified: Secondary | ICD-10-CM

## 2014-06-10 DIAGNOSIS — F3289 Other specified depressive episodes: Secondary | ICD-10-CM

## 2014-06-10 DIAGNOSIS — M47817 Spondylosis without myelopathy or radiculopathy, lumbosacral region: Secondary | ICD-10-CM

## 2014-06-10 DIAGNOSIS — I10 Essential (primary) hypertension: Secondary | ICD-10-CM

## 2014-06-10 DIAGNOSIS — F329 Major depressive disorder, single episode, unspecified: Secondary | ICD-10-CM

## 2014-06-10 DIAGNOSIS — G8929 Other chronic pain: Secondary | ICD-10-CM

## 2014-06-10 DIAGNOSIS — M4726 Other spondylosis with radiculopathy, lumbar region: Secondary | ICD-10-CM

## 2014-06-10 MED ORDER — CARVEDILOL 6.25 MG PO TABS: ORAL_TABLET | ORAL | Status: DC

## 2014-06-10 MED ORDER — HYDROCODONE-ACETAMINOPHEN 10-325 MG PO TABS: ORAL_TABLET | ORAL | Status: DC

## 2014-06-10 MED ORDER — DULOXETINE HCL 60 MG PO CPEP: ORAL_CAPSULE | ORAL | Status: DC

## 2014-06-10 MED ORDER — SPIRONOLACTONE 50 MG PO TABS: ORAL_TABLET | ORAL | Status: DC

## 2014-06-10 MED ORDER — OXYCODONE-ACETAMINOPHEN 10-325 MG PO TABS: 1.0000 | ORAL_TABLET | Freq: Four times a day (QID) | ORAL | Status: DC | PRN

## 2014-06-10 NOTE — Progress Notes (Signed)
Subjective:    Patient ID: Veronica Mullins, female    DOB: [birth year], 80 y.o.   MRN: [id number]   PCP: Jasara Corrigan,Panayiota Larkin, PA-C  Chief Complaint  Patient presents with  . Follow-up    Medications, allergies, past medical history, surgical history, family history, social history and problem list reviewed and updated.  Patient Active Problem List   Diagnosis Date Noted  . Constipation 09/24/2013  . Asthma 11/13/2012  . GERD (gastroesophageal reflux disease) 11/13/2012  . Depression 11/13/2012  . Hypothyroidism 11/13/2012  . Essential hypertension, benign 02/21/2012  . Chronic pain 02/21/2012  . Varicose veins   . B12 deficiency anemia   . Osteopenia   . Degenerative arthritis of spine 10/07/2009  . DIVERTICULOSIS OF COLON 02/23/2009  . ABNORMAL WEIGHT GAIN 02/23/2009  . GI BLEEDING 02/22/2009  . DYSPNEA 03/23/2008  . RESTLESS LEG SYNDROME 03/04/2008  . RHINITIS 03/04/2008  . Cough 03/04/2008  . MI (mitral incompetence) 10/07/2006  . Hepatic cyst 10/07/2004    Prior to Admission medications   Medication Sig Start Date End Date Taking? Authorizing Provider  amitriptyline (ELAVIL) 100 MG tablet Take 0.5-1 tablets (50-100 mg total) by mouth at bedtime. 03/11/14 03/11/15 Yes Vinessa Macconnell S Anye Brose, PA-C  carbidopa-levodopa (SINEMET IR) 25-250 MG per tablet  06/08/13  Yes Historical Provider, MD  carvedilol (COREG) 6.25 MG tablet TAKE 1 TABLET BY MOUTH TWICE A DAY WITH A MEAL   Yes Ukiah Trawick S Danetta Prom, PA-C  DULoxetine (CYMBALTA) 60 MG capsule TAKE ONE CAPSULE BY MOUTH EVERY DAY   Yes Magdalene Tardiff S Liberti Appleton, PA-C  HYDROcodone-acetaminophen (NORCO) 10-325 MG per tablet TAKE 1 TABLET BY MOUTH EVERY 8 HOURS AS NEEDED FOR PAIN 02/11/14  Yes Jaskirat Zertuche S Vincent Ehrler, PA-C  mometasone-formoterol (DULERA) 200-5 MCG/ACT AERO Inhale 2 puffs into the lungs 2 (two) times daily. 03/11/14  Yes Lyon Dumont S Benjamen Koelling, PA-C  NEXIUM 40 MG capsule TAKE ONE CAPSULE BY MOUTH EVERY DAY BEFORE BREAKFAST   Yes Taegen Lennox S Kaitlan Bin, PA-C    oxyCODONE-acetaminophen (PERCOCET) 10-325 MG per tablet Take 1 tablet by mouth every 6 (six) hours as needed (pain not relieved by hydrocodone). 03/11/14  Yes Captain Blucher S Chloie Loney, PA-C  PROAIR HFA 108 (90 BASE) MCG/ACT inhaler INHALE 2 PUFFS BY MOUTH EVERY 4 HOURS AS NEEDED FOR WHEEZE ,COUGH,& FOR SHORTNESS OF BREATH 04/23/13  Yes Cyncere Ruhe S Analese Sovine, PA-C  Rotigotine (NEUPRO) 3 MG/24HR PT24 Place onto the skin.   Yes Historical Provider, MD  spironolactone (ALDACTONE) 50 MG tablet TAKE 1 TABLET BY MOUTH EVERY DAY 05/10/14  Yes Eleanore E Egan, PA-C  vitamin B-12 (CYANOCOBALAMIN) 1000 MCG tablet Inject 1,000 mcg as directed every 30 (thirty) days.    Yes Historical Provider, MD  VITAMIN D, CHOLECALCIFEROL, PO Take by mouth once a week.    Yes Historical Provider, MD    HPI  Presents for follow-up.  Doing well, feels better than she has in a long time. Since her last visit, she fell out of bed-bruises to arms are resolving. Dermatology treatments to lesions on both hands and wrists this week-wounds are healing. Dependent edema of both lower extremities (does not involve the feet), resolves over night.  Review of Systems No chest pain, SOB, HA, dizziness, vision change, N/V, diarrhea, constipation, dysuria, urinary urgency or frequency, new myalgias, new arthralgias or rash.     Objective:   Physical Exam  Vitals reviewed. Constitutional: She is oriented to person, place, and time. Vital signs are normal. She appears well-developed and well-nourished. She is active and cooperative.  No distress.  BP 120/70  Pulse 82  Temp(Src) 98.7 F (37.1 C) (Oral)  Resp 16  Ht 5\' 3"  (1.6 m)  Wt 190 lb (86.183 kg)  BMI 33.67 kg/m2  SpO2 90%  HENT:  Head: Normocephalic and atraumatic.  Right Ear: Hearing normal.  Left Ear: Hearing normal.  Eyes: Conjunctivae are normal. No scleral icterus.  Neck: Normal range of motion. Neck supple. No thyromegaly present.  Cardiovascular: Normal rate, regular rhythm and  normal heart sounds.   Pulses:      Radial pulses are 2+ on the right side, and 2+ on the left side.  1+ pitting edema of both LE to the mid-shin.  Pulmonary/Chest: Effort normal and breath sounds normal.  Lymphadenopathy:       Head (right side): No tonsillar, no preauricular, no posterior auricular and no occipital adenopathy present.       Head (left side): No tonsillar, no preauricular, no posterior auricular and no occipital adenopathy present.    She has no cervical adenopathy.       Right: No supraclavicular adenopathy present.       Left: No supraclavicular adenopathy present.  Neurological: She is alert and oriented to person, place, and time. No sensory deficit.  Skin: Skin is warm, dry and intact. No rash noted. No cyanosis or erythema. Nails show no clubbing.  Psychiatric: She has a normal mood and affect.          Assessment & Plan:  1. Depression Stable. Continue current treatment. - DULoxetine (CYMBALTA) 60 MG capsule; TAKE ONE CAPSULE BY MOUTH EVERY DAY  Dispense: 90 capsule; Refill: 3  2. Essential hypertension, benign Stable. Continue current treatment. - carvedilol (COREG) 6.25 MG tablet; TAKE 1 TABLET BY MOUTH TWICE A DAY WITH A MEAL  Dispense: 180 tablet; Refill: 3 - spironolactone (ALDACTONE) 50 MG tablet; TAKE 1 TABLET BY MOUTH EVERY DAY  Dispense: 90 tablet; Refill: 3  3. Chronic pain 4. Osteoarthritis of spine with radiculopathy, lumbar region Stable.  Treats most flares with hydrocodone, and when that is not effective, switch temporarily to oxycodone. - HYDROcodone-acetaminophen (NORCO) 10-325 MG per tablet; TAKE 1 TABLET BY MOUTH EVERY 8 HOURS AS NEEDED FOR PAIN  Dispense: 90 tablet; Refill: 0 - oxyCODONE-acetaminophen (PERCOCET) 10-325 MG per tablet; Take 1 tablet by mouth every 6 (six) hours as needed (pain not relieved by hydrocodone).  Dispense: 30 tablet; Refill: 0  Follow-up with me in January 2016, sooner if needed.  If she prefers an appointment  in the interim, she can schedule with Dr. Katrinka Blazing or Dr. Conley Rolls.  Fernande Bras, PA-C Physician Assistant-Certified [facility]

## 2014-06-22 ENCOUNTER — Ambulatory Visit: Payer: Medicare Other | Admitting: Physician Assistant

## 2014-07-28 ENCOUNTER — Other Ambulatory Visit: Payer: Self-pay | Admitting: Physician Assistant

## 2014-08-21 ENCOUNTER — Other Ambulatory Visit: Payer: Self-pay | Admitting: Physician Assistant

## 2014-10-04 ENCOUNTER — Encounter: Payer: Self-pay | Admitting: Physician Assistant

## 2014-10-08 ENCOUNTER — Other Ambulatory Visit: Payer: Self-pay | Admitting: Physician Assistant

## 2014-11-03 ENCOUNTER — Other Ambulatory Visit: Payer: Self-pay | Admitting: Physician Assistant

## 2014-11-05 ENCOUNTER — Other Ambulatory Visit: Payer: Self-pay | Admitting: Physician Assistant

## 2014-11-07 IMAGING — CR DG CHEST 2V
2 series · 2 of 2 positions shown · non-contrast
Comparison: None

CLINICAL DATA: Followup pneumonia

EXAM:
CHEST  2 VIEW

[PA]
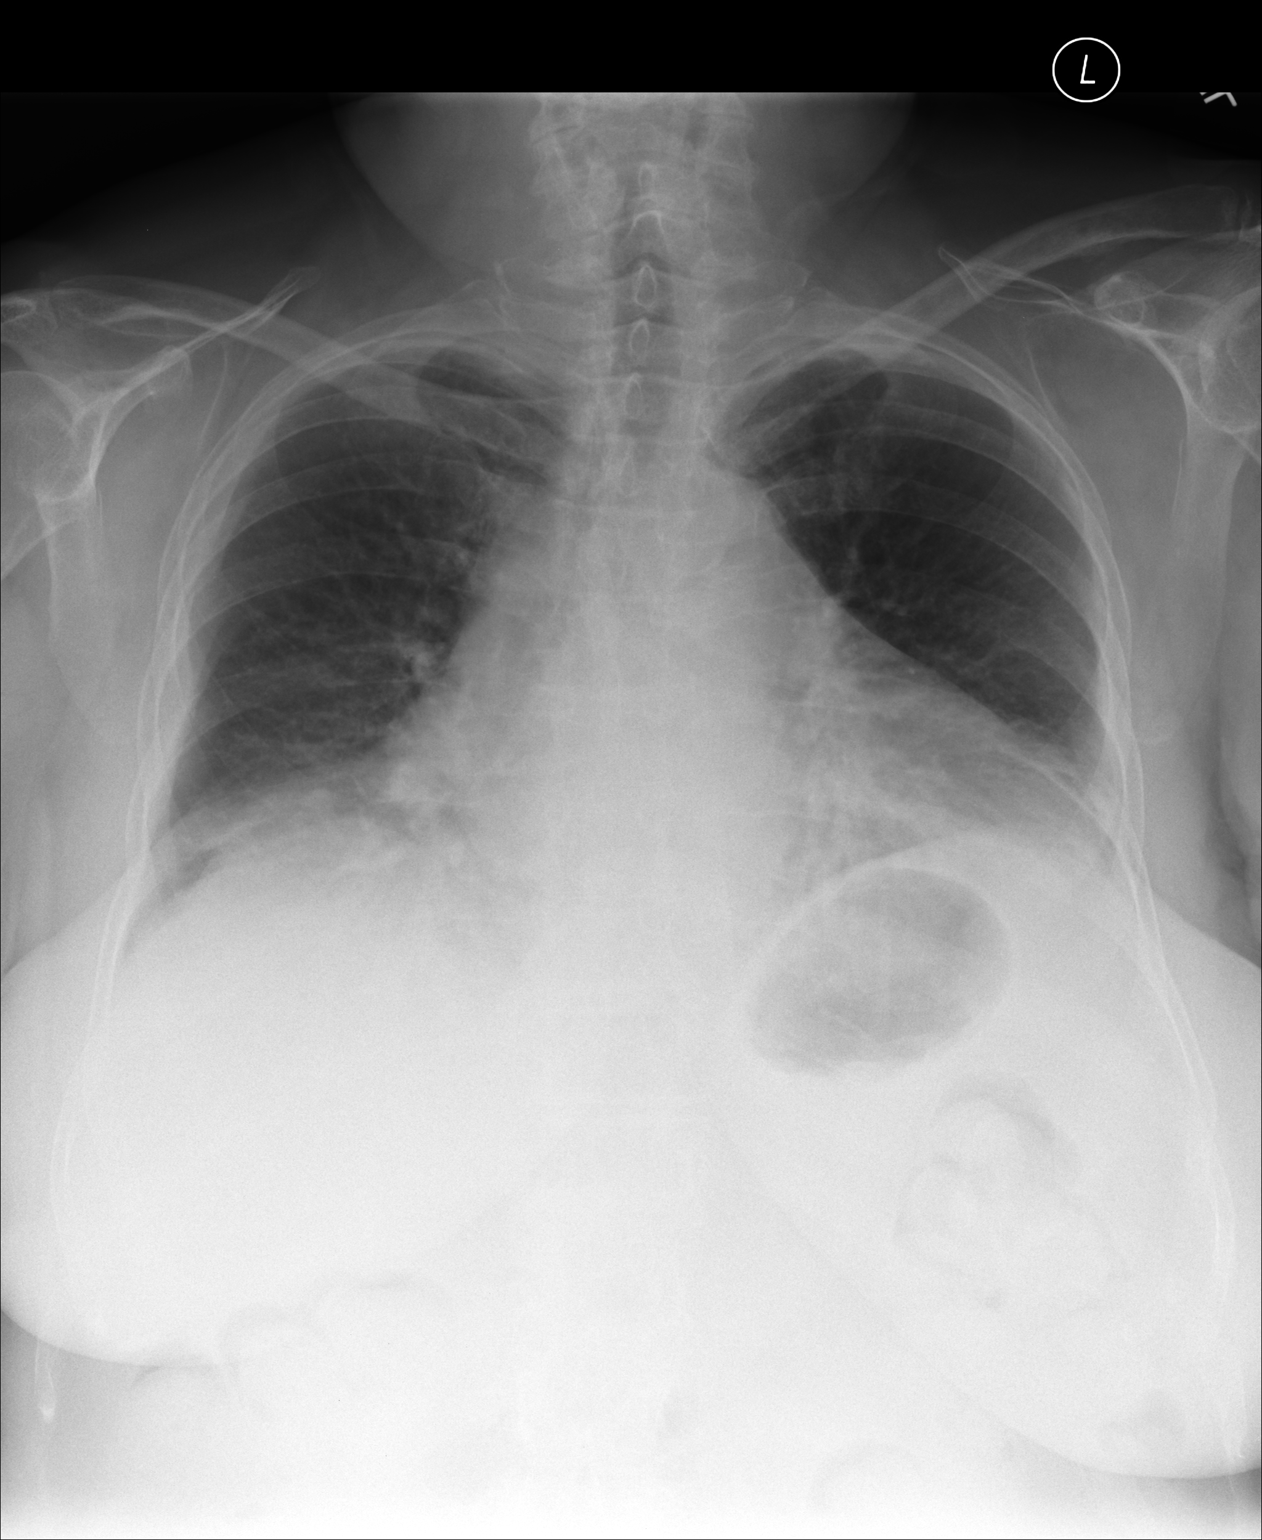

[lateral]
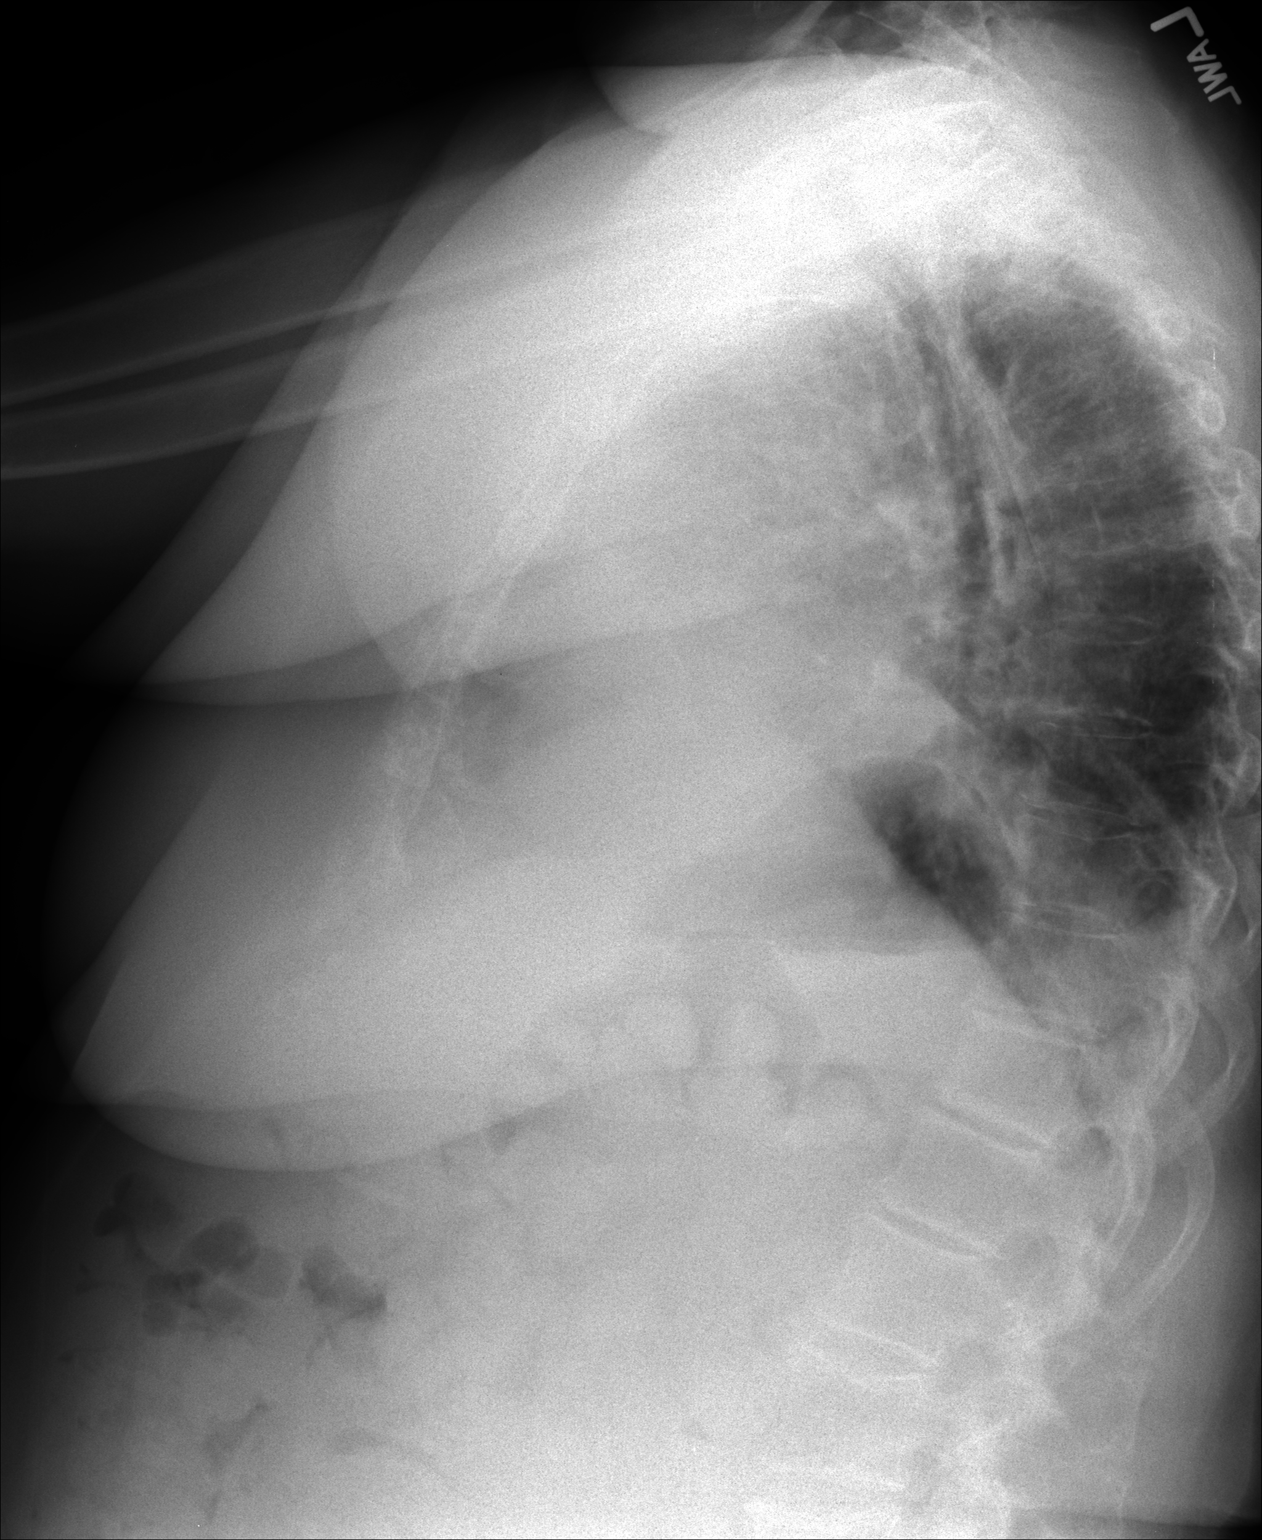

[2 of 2 positions shown; findings below may reference images not displayed]

FINDINGS: The heart size is enlarged. The lung volumes are low. There is
atelectasis noted in both lung bases. No pleural effusion or edema
identified. Mild degenerative disc disease noted within the thoracic
spine.
IMPRESSION: 1. Cardiac enlargement.
2. Low lung volumes with bibasilar atelectasis.

## 2014-11-20 ENCOUNTER — Other Ambulatory Visit: Payer: Self-pay | Admitting: Physician Assistant

## 2014-11-24 ENCOUNTER — Telehealth: Payer: Self-pay

## 2014-11-24 MED ORDER — NEXIUM 40 MG PO CPDR: DELAYED_RELEASE_CAPSULE | ORAL | Status: DC

## 2014-11-24 NOTE — Telephone Encounter (Signed)
Pt called to see if Chelle can call her in Nexium for 90 instead of 30 due to it being the same price, she uses the [facility] on [address] [place] number is [phone] Please call patient if we are able to do this. [phone]

## 2014-11-24 NOTE — Telephone Encounter (Signed)
Sent in #90 for pt LM to advised.

## 2014-12-09 ENCOUNTER — Encounter: Payer: Self-pay | Admitting: Physician Assistant

## 2014-12-09 ENCOUNTER — Ambulatory Visit (INDEPENDENT_AMBULATORY_CARE_PROVIDER_SITE_OTHER): Payer: Medicare Other | Admitting: Physician Assistant

## 2014-12-09 VITALS — BP 118/67 | HR 95 | Temp 98.1°F | Resp 16 | Ht 62.75 in | Wt 182.2 lb

## 2014-12-09 DIAGNOSIS — Z13220 Encounter for screening for lipoid disorders: Secondary | ICD-10-CM

## 2014-12-09 DIAGNOSIS — J45909 Unspecified asthma, uncomplicated: Secondary | ICD-10-CM

## 2014-12-09 DIAGNOSIS — Z23 Encounter for immunization: Secondary | ICD-10-CM

## 2014-12-09 DIAGNOSIS — G8929 Other chronic pain: Secondary | ICD-10-CM

## 2014-12-09 DIAGNOSIS — D519 Vitamin B12 deficiency anemia, unspecified: Secondary | ICD-10-CM

## 2014-12-09 DIAGNOSIS — E2839 Other primary ovarian failure: Secondary | ICD-10-CM

## 2014-12-09 DIAGNOSIS — E039 Hypothyroidism, unspecified: Secondary | ICD-10-CM

## 2014-12-09 LAB — COMPLETE METABOLIC PANEL WITH GFR
ALBUMIN: 4 g/dL (ref 3.5–5.2)
ALT: 8 U/L (ref 0–35)
AST: 17 U/L (ref 0–37)
Alkaline Phosphatase: 88 U/L (ref 39–117)
BUN: 20 mg/dL (ref 6–23)
CALCIUM: 9.1 mg/dL (ref 8.4–10.5)
CHLORIDE: 101 meq/L (ref 96–112)
CO2: 30 meq/L (ref 19–32)
Creat: 0.74 mg/dL (ref 0.50–1.10)
GFR, Est African American: 88 mL/min
GFR, Est Non African American: 76 mL/min
GLUCOSE: 101 mg/dL — AB (ref 70–99)
POTASSIUM: 4.7 meq/L (ref 3.5–5.3)
Sodium: 138 mEq/L (ref 135–145)
TOTAL PROTEIN: 7.1 g/dL (ref 6.0–8.3)
Total Bilirubin: 0.5 mg/dL (ref 0.2–1.2)

## 2014-12-09 LAB — TSH: TSH: 2.724 u[IU]/mL (ref 0.350–4.500)

## 2014-12-09 LAB — VITAMIN B12: Vitamin B-12: >2000 pg/mL — ABNORMAL HIGH (ref 211–911)

## 2014-12-09 MED ORDER — OXYCODONE-ACETAMINOPHEN 10-325 MG PO TABS: 1.0000 | ORAL_TABLET | Freq: Four times a day (QID) | ORAL | Status: DC | PRN

## 2014-12-09 NOTE — Patient Instructions (Signed)
Keep up the great work!

## 2014-12-09 NOTE — Progress Notes (Signed)
Subjective:    Patient ID: Veronica Mullins, female    DOB: [birth year], 81 y.o.   MRN: [id number]   PCP: Vegas Coffin,Davina Howlett, PA-C  Chief Complaint  Patient presents with  . Follow-up  . Depression  . Thyroid    Allergies  Allergen Reactions  . Keflex [Cephalexin]   . Penicillins     Throat swells    Patient Active Problem List   Diagnosis Date Noted  . Constipation 09/24/2013  . Asthma 11/13/2012  . GERD (gastroesophageal reflux disease) 11/13/2012  . Depression 11/13/2012  . Hypothyroidism 11/13/2012  . Essential hypertension, benign 02/21/2012  . Chronic pain 02/21/2012  . Varicose veins   . B12 deficiency anemia   . Osteopenia   . Degenerative arthritis of spine 10/07/2009  . Menopausal and postmenopausal disorder 10/07/2009  . DIVERTICULOSIS OF COLON 02/23/2009  . ABNORMAL WEIGHT GAIN 02/23/2009  . GI BLEEDING 02/22/2009  . DYSPNEA 03/23/2008  . RESTLESS LEG SYNDROME 03/04/2008  . RHINITIS 03/04/2008  . Cough 03/04/2008  . MI (mitral incompetence) 10/07/2006  . Hepatic cyst 10/07/2004    Prior to Admission medications   Medication Sig Start Date End Date Taking? Authorizing Provider  amitriptyline (ELAVIL) 100 MG tablet Take 0.5-1 tablets (50-100 mg total) by mouth at bedtime. 03/11/14 03/11/15 Yes Heavenleigh Petruzzi S Meridian Scherger, PA-C  carbidopa-levodopa (SINEMET IR) 25-250 MG per tablet  06/08/13  Yes Historical Provider, MD  carvedilol (COREG) 6.25 MG tablet TAKE 1 TABLET BY MOUTH TWICE A DAY WITH A MEAL 06/10/14  Yes Hagan Vanauken S Kajuan Guyton, PA-C  DULoxetine (CYMBALTA) 60 MG capsule TAKE ONE CAPSULE BY MOUTH EVERY DAY 07/29/14  Yes [place]aclara Acklin S Keimani Laufer, PA-C  HYDROcodone-acetaminophen (NORCO) 10-325 MG per tablet TAKE 1 TABLET BY MOUTH EVERY 8 HOURS AS NEEDED FOR PAIN 06/10/14  Yes Kelisha Dall S Mena Simonis, PA-C  mometasone-formoterol (DULERA) 200-5 MCG/ACT AERO Inhale 2 puffs into the lungs 2 (two) times daily. 03/11/14  Yes Riddik Senna S Laurali Goddard, PA-C  NEXIUM 40 MG capsule TAKE ONE CAPSULE BY MOUTH EVERY DAY  BEFORE BREAKFAST 11/24/14  Yes Kaelum Kissick S Torry Istre, PA-C  oxyCODONE-acetaminophen (PERCOCET) 10-325 MG per tablet Take 1 tablet by mouth every 6 (six) hours as needed (pain not relieved by hydrocodone). 06/10/14  Yes Jeannene Tschetter S Shantrell Placzek, PA-C  PROAIR HFA 108 (90 BASE) MCG/ACT inhaler INHALE 2 PUFFS BY MOUTH EVERY 4 HOURS AS NEEDED FOR WHEEZE ,COUGH,& FOR SHORTNESS OF BREATH 04/23/13  Yes Maelin Kurkowski S Gracin Mcpartland, PA-C  Rotigotine (NEUPRO) 3 MG/24HR PT24 Place onto the skin.   Yes Historical Provider, MD  spironolactone (ALDACTONE) 50 MG tablet TAKE 1 TABLET BY MOUTH EVERY DAY 06/10/14  Yes Mairi Stagliano S Archita Lomeli, PA-C  vitamin B-12 (CYANOCOBALAMIN) 1000 MCG tablet Take 1,000 mcg by mouth daily.    Yes Historical Provider, MD  VITAMIN D, CHOLECALCIFEROL, PO Take by mouth once a week.    Yes Historical Provider, MD  docusate sodium (COLACE) 100 MG capsule Take 100 mg by mouth 2 (two) times daily as needed. 09/16/14   Historical Provider, MD    Medical, Surgical, Family and Social History reviewed and updated.  HPI  Presents for follow-up and medication refills. Only needs oxycodone today. Tries to limit pain medication, but sometimes has trouble just walking short distances, or has pain with rest. She uses hydrocodone as her first line, and if her pain isn't relieved she switches to oxycodone.  Otherwise, she's doing well. Her husband has been diagnosed with borderline diabetes and so their eating habits will be changing. She's hopeful to lose  some additional weight (she's lost 8 pounds since I saw her last).  Her neurologist has retired, and she's not sure she'll keep seeing his replacement. Asks that I update her B12 level today with her lab draw. She injects herself with B12 monthly.  Her outstanding health maintenance is reviewed and we plan to bring her up to date today. She had a flu vaccine in 08/2014.  Review of Systems As above. No CP, SOB, HA, dizziness, N/V/diarrhea, urinary symptoms, diarrhea. Occasional  constipation. Docusate works well when she needs it.    Objective:   Physical Exam  Constitutional: She is oriented to person, place, and time. She appears well-developed and well-nourished. No distress.  BP 118/67 mmHg  Pulse 95  Temp(Src) 98.1 F (36.7 C) (Oral)  Resp 16  Ht 5' 2.75" (1.594 m)  Wt 182 lb 3.2 oz (82.645 kg)  BMI 32.53 kg/m2  SpO2 90%   HENT:  Head: Normocephalic and atraumatic.  Hearing aid on the RIGHT  Eyes: Conjunctivae are normal. No scleral icterus.  Neck: Normal range of motion and full passive range of motion without pain. Neck supple. No spinous process tenderness and no muscular tenderness present. No thyromegaly present.  Cardiovascular: Normal rate, regular rhythm, normal heart sounds and intact distal pulses.   Pulmonary/Chest: Effort normal and breath sounds normal.  Lymphadenopathy:    She has no cervical adenopathy.  Neurological: She is alert and oriented to person, place, and time. She has normal reflexes.  Skin: Skin is warm and dry.  Psychiatric: She has a normal mood and affect. Her speech is normal and behavior is normal. She does not express inappropriate judgment.    Office Spirometry Results: FEV1: 1.32 liters FVC: 1.72 liters FEV1/FVC: 76.7 % FVC  % Predicted: 75 liters FEV % Predicted: 105 liters FeF 25-75: 1.12 liters FeF 25-75 % Predicted: 91 Normal Spirometry.      Assessment & Plan:  1. Hypothyroidism, unspecified hypothyroidism type Await labs. - TSH - COMPLETE METABOLIC PANEL WITH GFR  2. Screening for hyperlipidemia [facility] doesn't cover screening for hyperlipidemia. Would not likely use statin in this patient. She will be making healthy eating changes due to her husband's new diagnosis.  3. Estrogen deficiency Needs screening DEXA. - DG Bone Density; Future  4. Chronic pain Daily pain. Continue to use PRN meds, with hydrocodone as first line, reserving oxycodone for back up. - oxyCODONE-acetaminophen (PERCOCET)  10-325 MG per tablet; Take 1 tablet by mouth every 6 (six) hours as needed (pain not relieved by hydrocodone).  Dispense: 90 tablet; Refill: 0  5. Asthma, unspecified asthma severity, uncomplicated Controlled. Continue current treatment. - Spirometry with graph  6. B12 deficiency anemia Continue monthly B12 injections. - Vitamin B12  7. Need for vaccination with 13-polyvalent pneumococcal conjugate vaccine - Pneumococcal conjugate vaccine 13-valent IM   Fernande Brashelle S. Winona Sison, PA-C Physician Assistant-Certified [facility]

## 2014-12-10 ENCOUNTER — Encounter: Payer: Self-pay | Admitting: Physician Assistant

## 2015-02-19 ENCOUNTER — Other Ambulatory Visit: Payer: Self-pay | Admitting: Physician Assistant

## 2015-02-22 ENCOUNTER — Other Ambulatory Visit: Payer: Self-pay | Admitting: Physician Assistant

## 2015-03-02 ENCOUNTER — Telehealth: Payer: Self-pay

## 2015-03-02 NOTE — Telephone Encounter (Signed)
03/02/15 Disc Received from [facility] and filed on shelf./IH °

## 2015-04-12 ENCOUNTER — Ambulatory Visit: Payer: Medicare Other | Admitting: Physician Assistant

## 2015-04-18 ENCOUNTER — Other Ambulatory Visit: Payer: Self-pay | Admitting: Physician Assistant

## 2015-05-04 ENCOUNTER — Other Ambulatory Visit: Payer: Self-pay | Admitting: Physician Assistant

## 2015-05-17 ENCOUNTER — Ambulatory Visit (INDEPENDENT_AMBULATORY_CARE_PROVIDER_SITE_OTHER): Payer: Medicare Other | Admitting: Physician Assistant

## 2015-05-17 ENCOUNTER — Encounter: Payer: Self-pay | Admitting: Physician Assistant

## 2015-05-17 VITALS — BP 101/60 | HR 88 | Temp 98.7°F | Resp 16 | Ht 63.0 in | Wt 172.0 lb

## 2015-05-17 DIAGNOSIS — M2011 Hallux valgus (acquired), right foot: Secondary | ICD-10-CM | POA: Diagnosis not present

## 2015-05-17 DIAGNOSIS — K219 Gastro-esophageal reflux disease without esophagitis: Secondary | ICD-10-CM | POA: Diagnosis not present

## 2015-05-17 DIAGNOSIS — M21611 Bunion of right foot: Secondary | ICD-10-CM

## 2015-05-17 DIAGNOSIS — B079 Viral wart, unspecified: Secondary | ICD-10-CM

## 2015-05-17 DIAGNOSIS — B078 Other viral warts: Secondary | ICD-10-CM

## 2015-05-17 DIAGNOSIS — G2581 Restless legs syndrome: Secondary | ICD-10-CM

## 2015-05-17 DIAGNOSIS — I10 Essential (primary) hypertension: Secondary | ICD-10-CM | POA: Diagnosis not present

## 2015-05-17 MED ORDER — ESOMEPRAZOLE MAGNESIUM 40 MG PO CPDR
DELAYED_RELEASE_CAPSULE | ORAL | Status: DC
Start: 2015-05-17 — End: 2015-09-20

## 2015-05-17 MED ORDER — SPIRONOLACTONE 50 MG PO TABS: ORAL_TABLET | ORAL | Status: DC

## 2015-05-17 NOTE — Progress Notes (Signed)
Patient ID: Veronica Mullins, female    DOB: June 17, 1931, 81 y.o.   MRN: [id number]  PCP: Olene Floss  Subjective:   Chief Complaint  Patient presents with  . Hypothyroidism  . Hyperlipidemia    HPI Presents for evaluation of a bunion and lesion on the RIGHT foot, and needs a couple of refills. TSH was normal in January and 01/2014. As her dose has been stable and she is asymptomatic, we agree that she doesn't need that updated today. She does need a lipid profile, but is not fasting, so we agree to defer that to her next visit.  She had Bilateral great toe bunionectomies previously, but didn't wear the cast as long as prescribed on the RIGHT. As such, the bunion has recurred and she'd like to have it re-done. There is no longer a podiatrist in her area, and she recommends referral to a provider in [place].  In addition, she has tender bumps on the 2nd and 3rd toes. She "doctors" them nightly by washing with betadine and wrapping with tissue for cushion. These cause pain with weight bearing and are beginning to limit her desired activities.  Asthma is well controlled, no respiratory infections so far this year!  Sees neurology in [place] for management for Vitamin B12 deficiency anemia, degenerative arthritis of the spine and Restless legs. She was long followed by Dr. Delbert Phenix, who recently retired. She has established with another provider in the practice, Ms. Orlene Och, and they are still getting to know each other. Has had to increase Sinemet to control the RLS and increase pain medication dose because of increased hip and back pain from worsening degenerative arthritis. Neurology office in [place] has notified her of a narcotics violation with their office. The patient did not realize that they had a policy, and was alarmed to receive a certified letter warning her that a second violation would result in being discharged from the practice.  I did not realize that the other  office was managing the narcotics at this point, so I had prescribed to her in good faith. THe patient doesn't like to take pain medication, and reserves it for when she can't tolerate the pain. Filled last Oxycodone 10/325 in April, Rx from Ms. Valorie Roosevelt. Nearly full bottle remains, tries not to take it.  Working on [employer], staying as active as she can. Pleased with recent 10 lb weight loss-had gained weight over the past 2 years.  Review of Systems  Constitutional: Negative for activity change, appetite change, fatigue and unexpected weight change.  HENT: Negative for congestion, dental problem, ear pain, hearing loss, mouth sores, postnasal drip, rhinorrhea, sneezing, sore throat, tinnitus and trouble swallowing.   Eyes: Negative for photophobia, pain, redness and visual disturbance.  Respiratory: Negative for cough, chest tightness and shortness of breath.   Cardiovascular: Negative for chest pain, palpitations and leg swelling.  Gastrointestinal: Negative for nausea, vomiting, abdominal pain, diarrhea, constipation and blood in stool.  Genitourinary: Negative for dysuria, urgency, frequency and hematuria.  Musculoskeletal: Positive for arthralgias (toes RIGHT foot). Negative for myalgias, gait problem and neck stiffness.  Skin: Negative for rash.  Neurological: Negative for dizziness, speech difficulty, weakness, light-headedness, numbness and headaches.  Hematological: Negative for adenopathy.  Psychiatric/Behavioral: Negative for confusion and sleep disturbance. The patient is not nervous/anxious.        Patient Active Problem List   Diagnosis Date Noted  . Bunion of right foot 05/17/2015  . Verruca vulgaris 05/17/2015  . Constipation 09/24/2013  . Asthma  11/13/2012  . GERD (gastroesophageal reflux disease) 11/13/2012  . Depression 11/13/2012  . Hypothyroidism 11/13/2012  . Essential hypertension, benign 02/21/2012  . Chronic pain 02/21/2012  . Varicose veins   . B12  deficiency anemia   . Osteopenia   . Degenerative arthritis of spine 10/07/2009  . Menopausal and postmenopausal disorder 10/07/2009  . DIVERTICULOSIS OF COLON 02/23/2009  . ABNORMAL WEIGHT GAIN 02/23/2009  . GI BLEEDING 02/22/2009  . DYSPNEA 03/23/2008  . RESTLESS LEG SYNDROME 03/04/2008  . RHINITIS 03/04/2008  . Cough 03/04/2008  . MI (mitral incompetence) 10/07/2006  . Hepatic cyst 10/07/2004     Prior to Admission medications   Medication Sig Start Date End Date Taking? Authorizing Provider  amitriptyline (ELAVIL) 100 MG tablet Take by mouth at bedtime. 03/14/15  Yes Historical Provider, MD  carbidopa-levodopa (SINEMET IR) 25-250 MG per tablet  06/08/13  Yes Historical Provider, MD  carvedilol (COREG) 6.25 MG tablet TAKE 1 TABLET BY MOUTH TWICE A DAY WITH A MEAL 06/10/14  Yes Ndia Sampath, PA-C  docusate sodium (COLACE) 100 MG capsule Take 100 mg by mouth 2 (two) times daily as needed. 09/16/14  Yes Historical Provider, MD  DULoxetine (CYMBALTA) 60 MG capsule TAKE ONE CAPSULE BY MOUTH EVERY DAY 05/04/15  Yes Tamey Wanek, PA-C  HYDROcodone-acetaminophen (NORCO) 10-325 MG per tablet TAKE 1 TABLET BY MOUTH EVERY 8 HOURS AS NEEDED FOR PAIN 06/10/14  Yes Alvilda Mckenna, PA-C  mometasone-formoterol (DULERA) 200-5 MCG/ACT AERO Inhale 2 puffs into the lungs 2 (two) times daily. 03/11/14  Yes Nekhi Liwanag, PA-C  NEXIUM 40 MG capsule TAKE ONE CAPSULE BY MOUTH EVERY DAY BEFORE BREAKFAST 04/18/15  Yes Messina Kosinski, PA-C  oxyCODONE-acetaminophen (PERCOCET) 10-325 MG per tablet Take 1 tablet by mouth every 6 (six) hours as needed (pain not relieved by hydrocodone). 12/09/14  Yes Lake Breeding, PA-C  PROAIR HFA 108 (90 BASE) MCG/ACT inhaler INHALE 2 PUFFS BY MOUTH EVERY 4 HOURS AS NEEDED FOR WHEEZE ,COUGH,& FOR SHORTNESS OF BREATH 04/23/13  Yes Cian Costanzo, PA-C  Rotigotine (NEUPRO) 3 MG/24HR PT24 Place onto the skin.   Yes Historical Provider, MD  spironolactone (ALDACTONE) 50 MG tablet TAKE 1  TABLET BY MOUTH EVERY DAY 06/10/14  Yes Alen Matheson, PA-C  vitamin B-12 (CYANOCOBALAMIN) 1000 MCG tablet Take 1,000 mcg by mouth daily.    Yes Historical Provider, MD  VITAMIN D, CHOLECALCIFEROL, PO Take by mouth once a week.    Yes Historical Provider, MD  amitriptyline (ELAVIL) 100 MG tablet Take 0.5-1 tablets (50-100 mg total) by mouth at bedtime. 03/11/14 03/11/15  Porfirio Oar, PA-C     Allergies  Allergen Reactions  . Keflex [Cephalexin]   . Penicillins     Throat swells       Objective:  Physical Exam  Constitutional: She is oriented to person, place, and time. She appears well-developed and well-nourished. No distress.  BP 101/60 mmHg  Pulse 88  Temp(Src) 98.7 F (37.1 C) (Oral)  Resp 16  Ht 5\' 3"  (1.6 m)  Wt 172 lb (78.019 kg)  BMI 30.48 kg/m2  SpO2 95%   Eyes: Conjunctivae are normal. No scleral icterus.  Neck: No thyromegaly present.  Cardiovascular: Normal rate, regular rhythm, normal heart sounds and intact distal pulses.   Pulmonary/Chest: Effort normal and breath sounds normal.  Musculoskeletal:       Right foot: There is decreased range of motion and deformity (Valgus deformity of RIGHT great toe, well healed surgical scar from previous  bunionectomy). There is no tenderness,  no bony tenderness, no swelling, normal capillary refill, no crepitus and no laceration.       Feet:  Lymphadenopathy:    She has no cervical adenopathy.  Neurological: She is alert and oriented to person, place, and time.  Skin: Skin is warm and dry.  Psychiatric: She has a normal mood and affect. Her behavior is normal.           Assessment & Plan:   1. Bunion of right foot - Ambulatory referral to Podiatry  2. Verruca vulgaris - Ambulatory referral to Podiatry  3. RESTLESS LEG SYNDROME Continue follow up with neurology. Copy of today's note to Ms. Valorie Roosevelt along with a letter to the patient that she can share. I will not prescribe narcotics, allowing the neurology office  to manage the pain component of her care.  4. Essential hypertension, benign Controlled. Continue current regimen. Refill spironolactone. - spironolactone (ALDACTONE) 50 MG tablet; TAKE 1 TABLET BY MOUTH EVERY DAY  Dispense: 90 tablet; Refill: 3  5. Gastroesophageal reflux disease without esophagitis Stable. Controlled. Refilled esomeprazole 40 mg; TAKE 1 CAPSULE BY MOUTH EVERY DAY Dispense: 90 capsule; Refill: 3   Fernande Bras, PA-C Physician Assistant-Certified [facility] .

## 2015-05-17 NOTE — Patient Instructions (Signed)
Keep up the great work!

## 2015-05-31 ENCOUNTER — Ambulatory Visit: Payer: Medicare Other | Admitting: Podiatry

## 2015-06-02 ENCOUNTER — Ambulatory Visit: Payer: Medicare Other | Admitting: Podiatry

## 2015-07-01 ENCOUNTER — Ambulatory Visit: Payer: Medicare Other | Admitting: Podiatry

## 2015-07-02 ENCOUNTER — Other Ambulatory Visit: Payer: Self-pay | Admitting: Physician Assistant

## 2015-07-22 ENCOUNTER — Ambulatory Visit: Payer: Medicare Other | Admitting: Podiatry

## 2015-07-29 ENCOUNTER — Other Ambulatory Visit: Payer: Self-pay | Admitting: Physician Assistant

## 2015-09-20 ENCOUNTER — Encounter: Payer: Self-pay | Admitting: Physician Assistant

## 2015-09-20 ENCOUNTER — Ambulatory Visit (INDEPENDENT_AMBULATORY_CARE_PROVIDER_SITE_OTHER): Payer: Medicare Other | Admitting: Physician Assistant

## 2015-09-20 VITALS — BP 142/70 | HR 72 | Temp 98.4°F | Resp 16 | Ht 62.5 in | Wt 163.6 lb

## 2015-09-20 DIAGNOSIS — K219 Gastro-esophageal reflux disease without esophagitis: Secondary | ICD-10-CM | POA: Diagnosis not present

## 2015-09-20 DIAGNOSIS — I10 Essential (primary) hypertension: Secondary | ICD-10-CM

## 2015-09-20 DIAGNOSIS — J4540 Moderate persistent asthma, uncomplicated: Secondary | ICD-10-CM

## 2015-09-20 DIAGNOSIS — E039 Hypothyroidism, unspecified: Secondary | ICD-10-CM

## 2015-09-20 DIAGNOSIS — F329 Major depressive disorder, single episode, unspecified: Secondary | ICD-10-CM | POA: Diagnosis not present

## 2015-09-20 DIAGNOSIS — F32A Depression, unspecified: Secondary | ICD-10-CM

## 2015-09-20 DIAGNOSIS — G8929 Other chronic pain: Secondary | ICD-10-CM

## 2015-09-20 DIAGNOSIS — Z23 Encounter for immunization: Secondary | ICD-10-CM

## 2015-09-20 LAB — CBC WITH DIFFERENTIAL/PLATELET
BASOS ABS: 0 10*3/uL (ref 0.0–0.1)
BASOS PCT: 0 % (ref 0–1)
EOS ABS: 0.1 10*3/uL (ref 0.0–0.7)
Eosinophils Relative: 1 % (ref 0–5)
HCT: 42.2 % (ref 36.0–46.0)
Hemoglobin: 14 g/dL (ref 12.0–15.0)
Lymphocytes Relative: 15 % (ref 12–46)
Lymphs Abs: 1.5 10*3/uL (ref 0.7–4.0)
MCH: 29.3 pg (ref 26.0–34.0)
MCHC: 33.2 g/dL (ref 30.0–36.0)
MCV: 88.3 fL (ref 78.0–100.0)
MPV: 10.2 fL (ref 8.6–12.4)
Monocytes Absolute: 0.7 10*3/uL (ref 0.1–1.0)
Monocytes Relative: 7 % (ref 3–12)
Neutro Abs: 7.5 10*3/uL (ref 1.7–7.7)
Neutrophils Relative %: 77 % (ref 43–77)
PLATELETS: 205 10*3/uL (ref 150–400)
RBC: 4.78 MIL/uL (ref 3.87–5.11)
RDW: 14.9 % (ref 11.5–15.5)
WBC: 9.7 10*3/uL (ref 4.0–10.5)

## 2015-09-20 LAB — COMPREHENSIVE METABOLIC PANEL
ALT: 15 U/L (ref 6–29)
AST: 19 U/L (ref 10–35)
Albumin: 4 g/dL (ref 3.6–5.1)
Alkaline Phosphatase: 71 U/L (ref 33–130)
BUN: 19 mg/dL (ref 7–25)
CHLORIDE: 101 mmol/L (ref 98–110)
CO2: 27 mmol/L (ref 20–31)
CREATININE: 0.66 mg/dL (ref 0.60–0.88)
Calcium: 8.9 mg/dL (ref 8.6–10.4)
GLUCOSE: 101 mg/dL — AB (ref 65–99)
Potassium: 4.3 mmol/L (ref 3.5–5.3)
SODIUM: 139 mmol/L (ref 135–146)
TOTAL PROTEIN: 6.9 g/dL (ref 6.1–8.1)
Total Bilirubin: 0.7 mg/dL (ref 0.2–1.2)

## 2015-09-20 LAB — TSH: TSH: 0.841 u[IU]/mL (ref 0.350–4.500)

## 2015-09-20 MED ORDER — CARVEDILOL 6.25 MG PO TABS: ORAL_TABLET | ORAL | Status: DC

## 2015-09-20 MED ORDER — MOMETASONE FURO-FORMOTEROL FUM 200-5 MCG/ACT IN AERO: 2.0000 | INHALATION_SPRAY | Freq: Two times a day (BID) | RESPIRATORY_TRACT | Status: DC

## 2015-09-20 MED ORDER — OXYCODONE-ACETAMINOPHEN 10-325 MG PO TABS: 1.0000 | ORAL_TABLET | Freq: Four times a day (QID) | ORAL | Status: AC | PRN

## 2015-09-20 MED ORDER — ALPRAZOLAM 0.5 MG PO TABS: 0.5000 mg | ORAL_TABLET | Freq: Every evening | ORAL | Status: DC | PRN

## 2015-09-20 MED ORDER — DULOXETINE HCL 60 MG PO CPEP: 60.0000 mg | ORAL_CAPSULE | Freq: Every day | ORAL | Status: DC

## 2015-09-20 MED ORDER — ESOMEPRAZOLE MAGNESIUM 40 MG PO CPDR: DELAYED_RELEASE_CAPSULE | ORAL | Status: DC

## 2015-09-20 MED ORDER — ALBUTEROL SULFATE HFA 108 (90 BASE) MCG/ACT IN AERS
INHALATION_SPRAY | RESPIRATORY_TRACT | Status: AC
Start: 2015-09-20 — End: ?

## 2015-09-20 NOTE — Progress Notes (Signed)
Patient ID: Veronica Mullins, female    DOB: September 17, 1931, 81 y.o.   MRN: [id number]  PCP: Olene Floss  Subjective:   Chief Complaint  Patient presents with  . Follow-up  . Hypertension  . Depression    "about the same"  . chronic pain    "about the same"  . Medication Refill    pt would like written Rx's    HPI Presents for evaluation of her chronic medical problems. Overall, she's feeling well.  She is adjusting to her new neurologist, and misses the easy rapport she had with Dr. Esmond Camper. She is feeling "keyed up" and having trouble falling asleep, and requests Xanax or Valium.  Several weeks ago she was seen at an urgent care near her for a sinus infection. She was initially prescribed Levaquin, but developed itching and increased agitation, so it was discontinued and replaced with Clindamycin. She's definitely improved, and has completed the steroid taper.  Her podiatrist wants to prescribe medication for what sounds like onychomycosis on one toe of one foot, but told her that she needs to be off the amitriptyline first, so the patient has stopped it.  No adverse effects from medication (she doesn't relate the increased irritability and difficulty falling asleep to having stopped the amitriptyline).   Review of Systems As above. No CP. SOB and wheezing are improving. Pain controlled with hydrocodone, and oxycodone when the hydrocodone isn't working. No new pain.    Patient Active Problem List   Diagnosis Date Noted  . Bunion of right foot 05/17/2015  . Verruca vulgaris 05/17/2015  . Constipation 09/24/2013  . Asthma 11/13/2012  . GERD (gastroesophageal reflux disease) 11/13/2012  . Depression 11/13/2012  . Hypothyroidism 11/13/2012  . Essential hypertension, benign 02/21/2012  . Chronic pain 02/21/2012  . Varicose veins   . B12 deficiency anemia   . Osteopenia   . Degenerative arthritis of spine 10/07/2009  . Menopausal and postmenopausal disorder  10/07/2009  . DIVERTICULOSIS OF COLON 02/23/2009  . ABNORMAL WEIGHT GAIN 02/23/2009  . GI BLEEDING 02/22/2009  . DYSPNEA 03/23/2008  . RESTLESS LEG SYNDROME 03/04/2008  . RHINITIS 03/04/2008  . Cough 03/04/2008  . MI (mitral incompetence) 10/07/2006  . Hepatic cyst 10/07/2004     Prior to Admission medications   Medication Sig Start Date End Date Taking? Authorizing Provider  carvedilol (COREG) 6.25 MG tablet TAKE 1 TABLET BY MOUTH TWICE A DAY WITH A MEAL. "OV NEEDED" 07/03/15  Yes Lanier Clam V, PA-C  docusate sodium (COLACE) 100 MG capsule Take 100 mg by mouth 2 (two) times daily as needed. 09/16/14  Yes Historical Provider, MD  DULoxetine (CYMBALTA) 60 MG capsule TAKE ONE CAPSULE BY MOUTH EVERY DAY 08/01/15  Yes Aloysuis Ribaudo, PA-C  esomeprazole (NEXIUM) 40 MG capsule TAKE ONE CAPSULE BY MOUTH EVERY DAY BEFORE BREAKFAST 05/17/15  Yes Gustave Lindeman, PA-C  HYDROcodone-acetaminophen (NORCO) 10-325 MG per tablet TAKE 1 TABLET BY MOUTH EVERY 8 HOURS AS NEEDED FOR PAIN 06/10/14  Yes Lainie Daubert, PA-C  mometasone-formoterol (DULERA) 200-5 MCG/ACT AERO Inhale 2 puffs into the lungs 2 (two) times daily. 03/11/14  Yes Alexzandria Massman, PA-C  oxyCODONE-acetaminophen (PERCOCET) 10-325 MG per tablet Take 1 tablet by mouth every 6 (six) hours as needed (pain not relieved by hydrocodone). 12/09/14  Yes Sahmya Arai, PA-C  PROAIR HFA 108 (90 BASE) MCG/ACT inhaler INHALE 2 PUFFS BY MOUTH EVERY 4 HOURS AS NEEDED FOR WHEEZE ,COUGH,& FOR SHORTNESS OF BREATH 04/23/13  Yes Jery Hollern, PA-C  Rotigotine (NEUPRO)  3 MG/24HR PT24 Place onto the skin.   Yes Historical Provider, MD  spironolactone (ALDACTONE) 50 MG tablet TAKE 1 TABLET BY MOUTH EVERY DAY 05/17/15  Yes Maeley Matton, PA-C  vitamin B-12 (CYANOCOBALAMIN) 1000 MCG tablet Take 1,000 mcg by mouth daily.    Yes Historical Provider, MD  VITAMIN D, CHOLECALCIFEROL, PO Take by mouth once a week.    Yes Historical Provider, MD  amitriptyline (ELAVIL) 100 MG  tablet Take 0.5-1 tablets (50-100 mg total) by mouth at bedtime. 03/11/14 03/11/15  Shalin Linders, PA-C  amitriptyline (ELAVIL) 100 MG tablet Take by mouth at bedtime. 03/14/15   Historical Provider, MD  carbidopa-levodopa (SINEMET IR) 25-250 MG per tablet  06/08/13   Historical Provider, MD     Allergies  Allergen Reactions  . Keflex [Cephalexin]   . Levofloxacin Itching  . Penicillins     Throat swells       Objective:  Physical Exam  Constitutional: She is oriented to person, place, and time. Vital signs are normal. She appears well-developed and well-nourished. She is active and cooperative. No distress.  BP 142/70 mmHg  Pulse 72  Temp(Src) 98.4 F (36.9 C) (Oral)  Resp 16  Ht 5' 2.5" (1.588 m)  Wt 163 lb 9.6 oz (74.208 kg)  BMI 29.43 kg/m2  SpO2 96%  HENT:  Head: Normocephalic and atraumatic.  Right Ear: Hearing normal.  Left Ear: Hearing normal.  Eyes: Conjunctivae are normal. No scleral icterus.  Neck: Normal range of motion. Neck supple. No thyromegaly present.  Cardiovascular: Normal rate, regular rhythm and normal heart sounds.   Pulses:      Radial pulses are 2+ on the right side, and 2+ on the left side.  Pulmonary/Chest: Effort normal. She has wheezes (soft).  Lymphadenopathy:       Head (right side): No tonsillar, no preauricular, no posterior auricular and no occipital adenopathy present.       Head (left side): No tonsillar, no preauricular, no posterior auricular and no occipital adenopathy present.    She has no cervical adenopathy.       Right: No supraclavicular adenopathy present.       Left: No supraclavicular adenopathy present.  Neurological: She is alert and oriented to person, place, and time. No sensory deficit.  Skin: Skin is warm, dry and intact. No rash noted. No cyanosis or erythema. Nails show no clubbing.  Psychiatric: She has a normal mood and affect. Her speech is normal and behavior is normal.           Assessment & Plan:   1.  Essential hypertension, benign Slightly above goal. She continues to work hard on healthy lifestyle changes and is pleased with weight loss. Goal is to lose another 10-15 lbs. - CBC with Differential/Platelet - Comprehensive metabolic panel - carvedilol (COREG) 6.25 MG tablet; TAKE 1 TABLET BY MOUTH TWICE A DAY WITH A MEAL.  Dispense: 180 tablet; Refill: 3  2. Hypothyroidism, unspecified hypothyroidism type - TSH  3. Chronic pain Stable. - DULoxetine (CYMBALTA) 60 MG capsule; Take 1 capsule (60 mg total) by mouth daily.  Dispense: 90 capsule; Refill: 3 - oxyCODONE-acetaminophen (PERCOCET) 10-325 MG tablet; Take 1 tablet by mouth every 6 (six) hours as needed (pain not relieved by hydrocodone).  Dispense: 90 tablet; Refill: 0  4. Depression Stable. - DULoxetine (CYMBALTA) 60 MG capsule; Take 1 capsule (60 mg total) by mouth daily.  Dispense: 90 capsule; Refill: 3 - ALPRAZolam (XANAX) 0.5 MG tablet; Take 1 tablet (0.5 mg  total) by mouth at bedtime as needed for anxiety or sleep.  Dispense: 30 tablet; Refill: 0  5. Need for influenza vaccination - Flu Vaccine QUAD 36+ mos IM  6. Gastroesophageal reflux disease without esophagitis Stable. - esomeprazole (NEXIUM) 40 MG capsule; TAKE ONE CAPSULE BY MOUTH EVERY DAY BEFORE BREAKFAST  Dispense: 90 capsule; Refill: 3  7. Asthma, moderate persistent, uncomplicated Stable. Recovering from recent URI. - mometasone-formoterol (DULERA) 200-5 MCG/ACT AERO; Inhale 2 puffs into the lungs 2 (two) times daily.  Dispense: 39 g; Refill: 3 - albuterol (PROAIR HFA) 108 (90 BASE) MCG/ACT inhaler; INHALE 2 PUFFS BY MOUTH EVERY 4 HOURS AS NEEDED FOR WHEEZE ,COUGH,& FOR SHORTNESS OF BREATH  Dispense: 8.5 each; Refill: 3   Return in about 4 months (around 01/21/2016).   Fernande Bras, PA-C Physician Assistant-Certified [facility]

## 2015-09-20 NOTE — Patient Instructions (Signed)
I will contact you with your lab results as soon as they are available.   If you have not heard from me in 2 weeks, please contact me.  The fastest way to get your results is to register for My Chart (see the instructions on the last page of this printout).   

## 2015-09-23 ENCOUNTER — Encounter: Payer: Self-pay | Admitting: Physician Assistant

## 2016-01-24 ENCOUNTER — Ambulatory Visit (INDEPENDENT_AMBULATORY_CARE_PROVIDER_SITE_OTHER): Payer: Medicare Other | Admitting: Physician Assistant

## 2016-01-24 ENCOUNTER — Encounter: Payer: Self-pay | Admitting: Physician Assistant

## 2016-01-24 VITALS — BP 138/58 | HR 74 | Temp 97.7°F | Resp 16 | Ht 62.5 in | Wt 176.6 lb

## 2016-01-24 DIAGNOSIS — I10 Essential (primary) hypertension: Secondary | ICD-10-CM | POA: Diagnosis not present

## 2016-01-24 DIAGNOSIS — J45909 Unspecified asthma, uncomplicated: Secondary | ICD-10-CM

## 2016-01-24 DIAGNOSIS — F418 Other specified anxiety disorders: Secondary | ICD-10-CM | POA: Diagnosis not present

## 2016-01-24 MED ORDER — AMITRIPTYLINE HCL 100 MG PO TABS: 100.0000 mg | ORAL_TABLET | Freq: Every day | ORAL | Status: DC

## 2016-01-24 MED ORDER — ALPRAZOLAM 1 MG PO TABS: 1.0000 mg | ORAL_TABLET | Freq: Every evening | ORAL | Status: DC | PRN

## 2016-01-24 NOTE — Progress Notes (Signed)
Patient ID: Veronica Mullins, female    DOB: 23-Jun-1931, 82 y.o.   MRN: [id number]  PCP: Olene Floss  Subjective:   Chief Complaint  Patient presents with  . Follow-up  . Hypertension  . Depression    "about the same"  . chronic pain    "about the same"  . GERD  . Hypothyroidism    HPI Presents for evaluation of chronic medical problems.  Continues to see neurology. Not as comfortable with Dr. Vikki Ports replacement.  Is now seeing dermatology for the itchy rash on the lower legs, thought to be a result of RLS. It's doing much better.  Alprazolam isn't working. "It takes right much of things like that do a lick of good for me." Asks if she can try a higher dose. Describes feeling anxious, like she's nervous about something, but doesn't know what. Needs a refill of amitriptyline.  Breathing feels stable to her. She's using the [facility] and albuterol PRN.    Review of Systems  Constitutional: Negative for fever, chills and fatigue.  Eyes: Negative for photophobia and visual disturbance.  Respiratory: Negative for cough, choking, chest tightness, shortness of breath and wheezing.   Cardiovascular: Negative for chest pain, palpitations and leg swelling.  Gastrointestinal: Negative for nausea, vomiting, diarrhea and constipation.  Genitourinary: Negative for dysuria, urgency, frequency and hematuria.  Musculoskeletal: Positive for back pain and arthralgias (RIGHT posterior hip). Negative for joint swelling and gait problem.  Neurological: Negative for dizziness, light-headedness and headaches.  Hematological: Negative for adenopathy. Does not bruise/bleed easily.  Psychiatric/Behavioral: Positive for sleep disturbance. The patient is nervous/anxious.        Patient Active Problem List   Diagnosis Date Noted  . Bunion of right foot 05/17/2015  . Verruca vulgaris 05/17/2015  . Constipation 09/24/2013  . Asthma 11/13/2012  . GERD (gastroesophageal reflux disease)  11/13/2012  . Anxiety and depression 11/13/2012  . Hypothyroidism 11/13/2012  . Essential hypertension, benign 02/21/2012  . Chronic pain 02/21/2012  . Varicose veins   . B12 deficiency anemia   . Osteopenia   . Degenerative arthritis of spine 10/07/2009  . Menopausal and postmenopausal disorder 10/07/2009  . DIVERTICULOSIS OF COLON 02/23/2009  . ABNORMAL WEIGHT GAIN 02/23/2009  . GI BLEEDING 02/22/2009  . DYSPNEA 03/23/2008  . RESTLESS LEG SYNDROME 03/04/2008  . RHINITIS 03/04/2008  . Cough 03/04/2008  . MI (mitral incompetence) 10/07/2006  . Hepatic cyst 10/07/2004     Prior to Admission medications   Medication Sig Start Date End Date Taking? Authorizing Provider  albuterol (PROAIR HFA) 108 (90 BASE) MCG/ACT inhaler INHALE 2 PUFFS BY MOUTH EVERY 4 HOURS AS NEEDED FOR WHEEZE ,COUGH,& FOR SHORTNESS OF BREATH 09/20/15  Yes Javonn Gauger, PA-C  amitriptyline (ELAVIL) 100 MG tablet Take 100 mg by mouth at bedtime.   Yes Historical Provider, MD  carbidopa-levodopa (SINEMET CR) 50-200 MG tablet Take by mouth 2 (two) times daily. 01/16/16  Yes Historical Provider, MD  carbidopa-levodopa (SINEMET IR) 25-250 MG per tablet  06/08/13  Yes Historical Provider, MD  carvedilol (COREG) 6.25 MG tablet TAKE 1 TABLET BY MOUTH TWICE A DAY WITH A MEAL. 09/20/15  Yes Hevin Jeffcoat, PA-C  Cholecalciferol ([employer] VIT D 5000 HIGH-POTENCY PO) Take by mouth.   Yes Historical Provider, MD  DULoxetine (CYMBALTA) 60 MG capsule Take 1 capsule (60 mg total) by mouth daily. 09/20/15  Yes Liridona Mashaw, PA-C  mometasone-formoterol (DULERA) 200-5 MCG/ACT AERO Inhale 2 puffs into the lungs 2 (two) times daily. 09/20/15  Yes  Khrista Braun, PA-C  NEUPRO 4 MG/24HR APPLY 1 PATCH BY TRANSDERMAL ROUTE DAILY FOR 30 DAYS 11/17/15  Yes Historical Provider, MD  oxyCODONE-acetaminophen (PERCOCET) 10-325 MG tablet Take 1 tablet by mouth every 6 (six) hours as needed (pain not relieved by hydrocodone). 09/20/15  Yes Gardner Servantes, PA-C  Rotigotine (NEUPRO) 3 MG/24HR PT24 Place onto the skin.   Yes Historical Provider, MD  spironolactone (ALDACTONE) 50 MG tablet TAKE 1 TABLET BY MOUTH EVERY DAY 05/17/15  Yes Brenyn Petrey, PA-C  triamcinolone cream (KENALOG) 0.1 % APPLY TO AFFECTED AREA TWICE A DAY AS NEEDED FOR ITCHING/RASH 12/30/15  Yes Historical Provider, MD  vitamin B-12 (CYANOCOBALAMIN) 1000 MCG tablet Take 1,000 mcg by mouth daily.    Yes Historical Provider, MD  ALPRAZolam Prudy Feeler) 0.5 MG tablet Take 1 tablet (0.5 mg total) by mouth at bedtime as needed for anxiety or sleep. Patient not taking: Reported on 01/24/2016 09/20/15   Porfirio Oar, PA-C     Allergies  Allergen Reactions  . Keflex [Cephalexin]   . Levofloxacin Itching  . Penicillins     Throat swells       Objective:  Physical Exam  Constitutional: She is oriented to person, place, and time. She appears well-developed and well-nourished. She is active and cooperative. No distress.  BP 138/58 mmHg  Pulse 74  Temp(Src) 97.7 F (36.5 C) (Oral)  Resp 16  Ht 5' 2.5" (1.588 m)  Wt 176 lb 9.6 oz (80.105 kg)  BMI 31.77 kg/m2  SpO2 93%   HENT:  Head: Normocephalic and atraumatic.  Right Ear: Hearing normal.  Left Ear: Hearing normal.  Eyes: Conjunctivae are normal. No scleral icterus.  Neck: Normal range of motion. Neck supple. No thyromegaly present.  Cardiovascular: Normal rate, regular rhythm and normal heart sounds.   Pulses:      Radial pulses are 2+ on the right side, and 2+ on the left side.  Pulmonary/Chest: Effort normal. She has wheezes (scattered, high-pitched soft musical wheezes).  Lymphadenopathy:       Head (right side): No tonsillar, no preauricular, no posterior auricular and no occipital adenopathy present.       Head (left side): No tonsillar, no preauricular, no posterior auricular and no occipital adenopathy present.    She has no cervical adenopathy.       Right: No supraclavicular adenopathy present.        Left: No supraclavicular adenopathy present.  Neurological: She is alert and oriented to person, place, and time. No sensory deficit.  Skin: Skin is warm, dry and intact. No rash noted. No cyanosis or erythema. Nails show no clubbing.  Psychiatric: She has a normal mood and affect. Her speech is normal and behavior is normal.           Assessment & Plan:   1. Essential hypertension, benign Controlled. Continue current treatment.   2. Asthma, unspecified asthma severity, uncomplicated Stable. Encouraged daily use of Dulera, with PRN use of albuterol, but she'd rather not.  3. Depression with anxiety Increase alprazolam to 1 mg at HS as needed.  - amitriptyline (ELAVIL) 100 MG tablet; Take 1 tablet (100 mg total) by mouth at bedtime.  Dispense: 90 tablet; Refill: 3 - ALPRAZolam (XANAX) 1 MG tablet; Take 1 tablet (1 mg total) by mouth at bedtime as needed for anxiety or sleep.  Dispense: 30 tablet; Refill: 0   Return in about 4 months (around 05/23/2016).   Fernande Bras, PA-C Physician Assistant-Certified [facility]  [facility]  

## 2016-05-22 ENCOUNTER — Ambulatory Visit (INDEPENDENT_AMBULATORY_CARE_PROVIDER_SITE_OTHER): Payer: Medicare Other | Admitting: Physician Assistant

## 2016-05-22 ENCOUNTER — Encounter: Payer: Self-pay | Admitting: Physician Assistant

## 2016-05-22 VITALS — BP 123/71 | HR 71 | Temp 98.4°F | Resp 16 | Ht 63.0 in | Wt 169.8 lb

## 2016-05-22 DIAGNOSIS — I10 Essential (primary) hypertension: Secondary | ICD-10-CM | POA: Diagnosis not present

## 2016-05-22 DIAGNOSIS — F418 Other specified anxiety disorders: Secondary | ICD-10-CM

## 2016-05-22 DIAGNOSIS — F32A Depression, unspecified: Secondary | ICD-10-CM

## 2016-05-22 DIAGNOSIS — F329 Major depressive disorder, single episode, unspecified: Secondary | ICD-10-CM

## 2016-05-22 DIAGNOSIS — E039 Hypothyroidism, unspecified: Secondary | ICD-10-CM

## 2016-05-22 DIAGNOSIS — G2581 Restless legs syndrome: Secondary | ICD-10-CM

## 2016-05-22 DIAGNOSIS — R202 Paresthesia of skin: Secondary | ICD-10-CM

## 2016-05-22 DIAGNOSIS — F419 Anxiety disorder, unspecified: Secondary | ICD-10-CM

## 2016-05-22 LAB — LIPID PANEL
CHOL/HDL RATIO: 3 ratio (ref <=5.0)
CHOLESTEROL: 143 mg/dL (ref 125–200)
HDL: 47 mg/dL (ref >=46)
LDL Cholesterol: 75 mg/dL (ref <130)
TRIGLYCERIDES: 106 mg/dL (ref <150)
VLDL: 21 mg/dL (ref <30)

## 2016-05-22 LAB — TSH: TSH: 1.59 m[IU]/L

## 2016-05-22 NOTE — Patient Instructions (Signed)
Wear the wrist splint at night and anytime during the day that you want/can.    IF you received an x-ray today, you will receive an invoice from [facility]. Please contact [facility] at [phone] with questions or concerns regarding your invoice.   IF you received labwork today, you will receive an invoice from [facility]. Please contact [facility] at [phone] with questions or concerns regarding your invoice.   Our billing staff will not be able to assist you with questions regarding bills from these companies.  You will be contacted with the lab results as soon as they are available. The fastest way to get your results is to activate your My Chart account. Instructions are located on the last page of this paperwork. If you have not heard from us regarding the results in 2 weeks, please contact this office.

## 2016-05-22 NOTE — Progress Notes (Signed)
Patient ID: Veronica Mullins, female    DOB: 15-Mar-1931, 82 y.o.   MRN: [id number]  PCP: Porfirio Oarhelle Niya Behler, PA-C  Subjective:   Chief Complaint  Patient presents with  . follow up depression  . follow up htn    HPI Presents for evaluation of HTN and depression.  She is tolerating her medications well, and doesn't need refills at this time.  RIGHT hand tingling.  Started one day upon waking, and she thought it was from sleeping on it wrong. Not dropping things. RIGHT hand dominant. Had carpal tunnel release on the LEFT years ago.  RLS symptoms are not as well controlled as before, the meds seem to wear off and she awakens about 4 am.  Breathing is good these days and she likes her new pulmonologist.  Review of Systems  Constitutional: Negative.  Unexpected weight change: intentional weight loss.  HENT: Negative.   Eyes: Negative.   Respiratory: Negative.   Cardiovascular: Negative.   Gastrointestinal: Negative.   Endocrine: Negative.   Genitourinary: Negative.   Musculoskeletal: Negative.   Skin: Negative.   Neurological: Positive for numbness (tingling in RIGHT hand). Negative for dizziness, tremors, seizures, syncope, facial asymmetry, speech difficulty, weakness, light-headedness and headaches.  Hematological: Negative for adenopathy. Does not bruise/bleed easily.  Psychiatric/Behavioral: Positive for sleep disturbance and dysphoric mood. Negative for suicidal ideas, hallucinations, behavioral problems, confusion, self-injury, decreased concentration and agitation. The patient is not nervous/anxious and is not hyperactive.        Patient Active Problem List   Diagnosis Date Noted  . Bunion of right foot 05/17/2015  . Verruca vulgaris 05/17/2015  . Constipation 09/24/2013  . Asthma 11/13/2012  . GERD (gastroesophageal reflux disease) 11/13/2012  . Anxiety and depression 11/13/2012  . Hypothyroidism 11/13/2012  . Essential hypertension, benign 02/21/2012  . Chronic  pain 02/21/2012  . Varicose veins   . B12 deficiency anemia   . Osteopenia   . Degenerative arthritis of spine 10/07/2009  . Menopausal and postmenopausal disorder 10/07/2009  . DIVERTICULOSIS OF COLON 02/23/2009  . ABNORMAL WEIGHT GAIN 02/23/2009  . GI BLEEDING 02/22/2009  . DYSPNEA 03/23/2008  . RESTLESS LEG SYNDROME 03/04/2008  . RHINITIS 03/04/2008  . Cough 03/04/2008  . MI (mitral incompetence) 10/07/2006  . Hepatic cyst 10/07/2004     Prior to Admission medications   Medication Sig Start Date End Date Taking? Authorizing Provider  albuterol (PROAIR HFA) 108 (90 BASE) MCG/ACT inhaler INHALE 2 PUFFS BY MOUTH EVERY 4 HOURS AS NEEDED FOR WHEEZE ,COUGH,& FOR SHORTNESS OF BREATH 09/20/15  Yes Maleni Seyer, PA-C  carbidopa-levodopa (SINEMET CR) 50-200 MG tablet Take by mouth 2 (two) times daily. 01/16/16  Yes Historical Provider, MD  carbidopa-levodopa (SINEMET IR) 25-250 MG per tablet  06/08/13  Yes Historical Provider, MD  carvedilol (COREG) 6.25 MG tablet TAKE 1 TABLET BY MOUTH TWICE A DAY WITH A MEAL. 09/20/15  Yes Hydeia Mcatee, PA-C  Cholecalciferol ([employer] VIT D 5000 HIGH-POTENCY PO) Take by mouth.   Yes Historical Provider, MD  DULoxetine (CYMBALTA) 60 MG capsule Take 1 capsule (60 mg total) by mouth daily. 09/20/15  Yes Sarinity Dicicco, PA-C  NEUPRO 4 MG/24HR APPLY 1 PATCH BY TRANSDERMAL ROUTE DAILY FOR 30 DAYS 11/17/15  Yes Historical Provider, MD  oxyCODONE-acetaminophen (PERCOCET) 10-325 MG tablet Take 1 tablet by mouth every 6 (six) hours as needed (pain not relieved by hydrocodone). 09/20/15  Yes Fani Rotondo, PA-C  Rotigotine (NEUPRO) 3 MG/24HR PT24 Place onto the skin.   Yes Historical Provider, MD  spironolactone (ALDACTONE) 50 MG tablet TAKE 1 TABLET BY MOUTH EVERY DAY 05/17/15  Yes Tyra Gural, PA-C  vitamin B-12 (CYANOCOBALAMIN) 1000 MCG tablet Take 1,000 mcg by mouth daily.    Yes Historical Provider, MD  ALPRAZolam Prudy Feeler) 1 MG tablet Take 1 tablet (1 mg total) by  mouth at bedtime as needed for anxiety or sleep. Patient not taking: Reported on 05/22/2016 01/24/16   Porfirio Oar, PA-C  amitriptyline (ELAVIL) 100 MG tablet Take 1 tablet (100 mg total) by mouth at bedtime. Patient not taking: Reported on 05/22/2016 01/24/16   Shea Kapur, PA-C  mometasone-formoterol (DULERA) 200-5 MCG/ACT AERO Inhale 2 puffs into the lungs 2 (two) times daily. Patient not taking: Reported on 05/22/2016 09/20/15   Porfirio Oar, PA-C  triamcinolone cream (KENALOG) 0.1 % Reported on 05/22/2016 12/30/15   Historical Provider, MD     Allergies  Allergen Reactions  . Keflex [Cephalexin]   . Levofloxacin Itching  . Penicillins     Throat swells       Objective:  Physical Exam  Constitutional: She is oriented to person, place, and time. She appears well-developed and well-nourished. She is active and cooperative. No distress.  BP 123/71 mmHg  Pulse 71  Temp(Src) 98.4 F (36.9 C) (Oral)  Resp 16  Ht  (1.6 m)  Wt 169 lb 12.8 oz (77.021 kg)  BMI 30.09 kg/m2  HENT:  Head: Normocephalic and atraumatic.  Right Ear: Hearing normal.  Left Ear: Hearing normal.  Eyes: Conjunctivae are normal. No scleral icterus.  Neck: Normal range of motion. Neck supple. No thyromegaly present.  Cardiovascular: Normal rate, regular rhythm and normal heart sounds.   Pulses:      Radial pulses are 2+ on the right side, and 2+ on the left side.  Pulmonary/Chest: Effort normal and breath sounds normal.  Musculoskeletal:  Phalen's and Tinel's do not reproduce her symptoms.  Lymphadenopathy:       Head (right side): No tonsillar, no preauricular, no posterior auricular and no occipital adenopathy present.       Head (left side): No tonsillar, no preauricular, no posterior auricular and no occipital adenopathy present.    She has no cervical adenopathy.       Right: No supraclavicular adenopathy present.       Left: No supraclavicular adenopathy present.  Neurological: She is alert  and oriented to person, place, and time. No sensory deficit.  Skin: Skin is warm, dry and intact. No rash noted. No cyanosis or erythema. Nails show no clubbing.  Psychiatric: She has a normal mood and affect. Her speech is normal and behavior is normal.       Wt Readings from Last 3 Encounters:  05/22/16 169 lb 12.8 oz (77.021 kg)  01/24/16 176 lb 9.6 oz (80.105 kg)  09/20/15 163 lb 9.6 oz (74.208 kg)       Assessment & Plan:   1. Essential hypertension, benign Controlled. Continue current treatment. - CBC with Differential/Platelet - TSH - Lipid panel  2. Hypothyroidism, unspecified hypothyroidism type Await lab results. - TSH  3. Anxiety and depression She thinks that this is as well controlled as possible. Doesn't want anything else.  4. RESTLESS LEG SYNDROME Less well controlled. She will discuss this with her neurologist.  5. Right hand paresthesia Trial of a wrist splint. If persists, consider additional evaluation of her c-spine. - Care order/instruction   Fernande Bras, PA-C Physician Assistant-Certified [facility]

## 2016-05-23 LAB — CBC WITH DIFFERENTIAL/PLATELET
BASOS PCT: 0 %
Basophils Absolute: 0 cells/uL (ref 0–200)
EOS PCT: 1 %
Eosinophils Absolute: 90 cells/uL (ref 15–500)
HEMATOCRIT: 43.8 % (ref 35.0–45.0)
HEMOGLOBIN: 14.2 g/dL (ref 11.7–15.5)
LYMPHS ABS: 1530 {cells}/uL (ref 850–3900)
Lymphocytes Relative: 17 %
MCH: 30 pg (ref 27.0–33.0)
MCHC: 32.4 g/dL (ref 32.0–36.0)
MCV: 92.6 fL (ref 80.0–100.0)
MONO ABS: 630 {cells}/uL (ref 200–950)
MPV: 11.6 fL (ref 7.5–12.5)
Monocytes Relative: 7 %
NEUTROS ABS: 6750 {cells}/uL (ref 1500–7800)
Neutrophils Relative %: 75 %
Platelets: 220 10*3/uL (ref 140–400)
RBC: 4.73 MIL/uL (ref 3.80–5.10)
RDW: 14.9 % (ref 11.0–15.0)
WBC: 9 10*3/uL (ref 3.8–10.8)

## 2016-05-25 ENCOUNTER — Encounter: Payer: Self-pay | Admitting: Physician Assistant

## 2016-06-26 ENCOUNTER — Other Ambulatory Visit: Payer: Self-pay

## 2016-06-26 DIAGNOSIS — I10 Essential (primary) hypertension: Secondary | ICD-10-CM

## 2016-06-26 MED ORDER — SPIRONOLACTONE 50 MG PO TABS: ORAL_TABLET | ORAL | 1 refills | Status: DC

## 2016-08-21 ENCOUNTER — Ambulatory Visit (INDEPENDENT_AMBULATORY_CARE_PROVIDER_SITE_OTHER): Payer: Medicare Other | Admitting: Physician Assistant

## 2016-08-21 ENCOUNTER — Encounter: Payer: Self-pay | Admitting: Physician Assistant

## 2016-08-21 VITALS — BP 128/80 | HR 68 | Temp 98.4°F | Resp 17 | Ht 63.0 in | Wt 178.0 lb

## 2016-08-21 DIAGNOSIS — I10 Essential (primary) hypertension: Secondary | ICD-10-CM | POA: Diagnosis not present

## 2016-08-21 DIAGNOSIS — D519 Vitamin B12 deficiency anemia, unspecified: Secondary | ICD-10-CM | POA: Diagnosis not present

## 2016-08-21 DIAGNOSIS — Z23 Encounter for immunization: Secondary | ICD-10-CM

## 2016-08-21 LAB — COMPREHENSIVE METABOLIC PANEL
ALBUMIN: 3.8 g/dL (ref 3.6–5.1)
ALK PHOS: 69 U/L (ref 33–130)
ALT: 3 U/L — AB (ref 6–29)
AST: 13 U/L (ref 10–35)
BILIRUBIN TOTAL: 0.7 mg/dL (ref 0.2–1.2)
BUN: 21 mg/dL (ref 7–25)
CALCIUM: 8.6 mg/dL (ref 8.6–10.4)
CO2: 26 mmol/L (ref 20–31)
Chloride: 98 mmol/L (ref 98–110)
Creat: 0.79 mg/dL (ref 0.60–0.88)
GLUCOSE: 87 mg/dL (ref 65–99)
Potassium: 4.7 mmol/L (ref 3.5–5.3)
Sodium: 133 mmol/L — ABNORMAL LOW (ref 135–146)
Total Protein: 6.5 g/dL (ref 6.1–8.1)

## 2016-08-21 LAB — CBC WITH DIFFERENTIAL/PLATELET
Basophils Absolute: 0 cells/uL (ref 0–200)
Basophils Relative: 0 %
EOS ABS: 144 {cells}/uL (ref 15–500)
Eosinophils Relative: 2 %
HEMATOCRIT: 43 % (ref 35.0–45.0)
Hemoglobin: 14.1 g/dL (ref 11.7–15.5)
LYMPHS PCT: 22 %
Lymphs Abs: 1584 cells/uL (ref 850–3900)
MCH: 30.9 pg (ref 27.0–33.0)
MCHC: 32.8 g/dL (ref 32.0–36.0)
MCV: 94.3 fL (ref 80.0–100.0)
MONO ABS: 576 {cells}/uL (ref 200–950)
MONOS PCT: 8 %
MPV: 10.9 fL (ref 7.5–12.5)
NEUTROS PCT: 68 %
Neutro Abs: 4896 cells/uL (ref 1500–7800)
PLATELETS: 190 10*3/uL (ref 140–400)
RBC: 4.56 MIL/uL (ref 3.80–5.10)
RDW: 13.6 % (ref 11.0–15.0)
WBC: 7.2 10*3/uL (ref 3.8–10.8)

## 2016-08-21 NOTE — Progress Notes (Signed)
Subjective:    Patient ID: Veronica Mullins, female    DOB: 1931-04-07, 82 y.o.   MRN: [id number] Chief Complaint  Patient presents with  . Follow-up    patient does not know what for??   HPI Patient presents today for follow-up of essential hypertension. Her other medical problems include hypothyroid, restless leg syndrome, anxiety and depression all managed by other specialties. Today patient reports most of her illnesses, including her HTN are stable. The only thing that is bothering her is her RLS. She reports increased frequency of symptoms, which include sharp, cramp-like pain in her lower legs. She reports having to take her medication early in the day as her symptoms start to occur around 2pm instead of 7pm like they usually do. Occasionally she will be woken up during the night with pain in her lower legs. She is currently seen by Neurology who handles her medications for RLS. Along with her RLS she has noticed a burning sensation in the bottom of her feet, patient has no hx of diabetes.    Past Medical History:  Diagnosis Date  . Allergy   . Asthma   . B12 deficiency anemia   . Colon polyps   . DDD (degenerative disc disease), cervical    causes numbness in left fingers, but hopes to avoid surgery  . History of GI bleed    colon polyps  . Hypertension   . Osteopenia   . RLS (restless legs syndrome)   . Vaginal stricture   . Varicose veins   . Vitamin D deficiency    Family History  Problem Relation Age of Onset  . Cancer Mother     breast-double mastectomy  . Cancer Father     prostate and bone   Social History   Social History  . Marital status: Married    Spouse name: Alden Server  . Number of children: 2  . Years of education: 14   Occupational History  . Retired    Social History Main Topics  . Smoking status: Never Smoker  . Smokeless tobacco: Never Used  . Alcohol use No  . Drug use: No  . Sexual activity: No     Comment: not since her hysterectomy, due to  dysareunia from scar tissue   Other Topics Concern  . Not on file   Social History Narrative   Lives with her husband.   Allergies  Allergen Reactions  . Keflex [Cephalexin]   . Levofloxacin Itching  . Penicillins     Throat swells   Patient Active Problem List   Diagnosis Date Noted  . Bunion of right foot 05/17/2015  . Verruca vulgaris 05/17/2015  . Constipation 09/24/2013  . Asthma 11/13/2012  . GERD (gastroesophageal reflux disease) 11/13/2012  . Anxiety and depression 11/13/2012  . Hypothyroidism 11/13/2012  . Essential hypertension, benign 02/21/2012  . Chronic pain 02/21/2012  . Varicose veins   . B12 deficiency anemia   . Osteopenia   . Degenerative arthritis of spine 10/07/2009  . Menopausal and postmenopausal disorder 10/07/2009  . DIVERTICULOSIS OF COLON 02/23/2009  . ABNORMAL WEIGHT GAIN 02/23/2009  . GI BLEEDING 02/22/2009  . DYSPNEA 03/23/2008  . RESTLESS LEG SYNDROME 03/04/2008  . RHINITIS 03/04/2008  . Cough 03/04/2008  . MI (mitral incompetence) 10/07/2006  . Hepatic cyst 10/07/2004    Review of Systems  Constitutional: Negative for activity change, fatigue and fever.  HENT: Negative for ear pain, sore throat and voice change.   Eyes: Negative for visual disturbance.  Respiratory: Positive for shortness of breath (With walking). Negative for chest tightness.   Cardiovascular: Negative for chest pain, palpitations and leg swelling.  Gastrointestinal: Negative for abdominal distention and abdominal pain.  Genitourinary: Negative for dysuria, frequency and urgency.  Musculoskeletal: Positive for myalgias (Pain in both legs).       Restless Leg Syndrome, Burning in feet  Skin: Negative for rash.  Neurological: Negative for tremors and headaches.  Psychiatric/Behavioral: Negative for agitation, behavioral problems and dysphoric mood.      Objective:   Physical Exam  Constitutional: She is oriented to person, place, and time. She appears  well-developed and well-nourished. No distress.  HENT:  Head: Normocephalic and atraumatic.  Right Ear: External ear normal.  Left Ear: External ear normal.  Mouth/Throat: No oropharyngeal exudate.  Eyes: Conjunctivae are normal. Pupils are equal, round, and reactive to light.  Neck: Neck supple.  Cardiovascular: Normal rate, regular rhythm, normal heart sounds and intact distal pulses.  Exam reveals no gallop and no friction rub.   No murmur heard. Pulmonary/Chest: Effort normal. No respiratory distress. She has wheezes (Expiritory wheeze). She exhibits tenderness.  Abdominal: Soft. There is no tenderness.  Musculoskeletal: She exhibits no edema or tenderness.  Lymphadenopathy:    She has no cervical adenopathy.  Neurological: She is oriented to person, place, and time. She displays abnormal reflex (Diminished patelar tenden rflex on left side. ). She exhibits normal muscle tone.  Skin: Skin is warm and dry. No rash noted.  Psychiatric: She has a normal mood and affect.       Assessment & Plan:  1. Essential hypertension, benign Stable. Will continue current regimen with follow-up in 6 months  - Comprehensive metabolic panel  2. B12 deficiency anemia Husband currently administers B12 shots. Will do CBC to recheck Hb/Hc - CBC with Differential/Platelet  3. Need for influenza vaccination  - Flu Vaccine QUAD 36+ mos IM

## 2016-08-21 NOTE — Patient Instructions (Signed)
     IF you received an x-ray today, you will receive an invoice from [facility]. Please contact [facility] at [phone] with questions or concerns regarding your invoice.   IF you received labwork today, you will receive an invoice from [facility]. Please contact [facility] at [phone] with questions or concerns regarding your invoice.   Our billing staff will not be able to assist you with questions regarding bills from these companies.  You will be contacted with the lab results as soon as they are available. The fastest way to get your results is to activate your My Chart account. Instructions are located on the last page of this paperwork. If you have not heard from us regarding the results in 2 weeks, please contact this office.      

## 2016-08-21 NOTE — Progress Notes (Signed)
Patient ID: Veronica HensenDixie Mullins, female    DOB: October 04, 1931, 82 y.o.   MRN: [id number]  PCP: Porfirio Oarhelle Gawain Crombie, PA-C  Subjective:   Chief Complaint  Patient presents with  . Follow-up    patient does not know what for??    HPI Presents for routine follow up of HTN..  This and other chronic issues have been well controlled for some time.  Today she reports burning in the bottom of her feet. Doesn't have diabetes. Wonders why. Currently receives monthly B12 injections, administered by her husband,Ernie prescribed by her neurologist, and Cymbalta. RLS symptoms occurring earlier in the day, now about 1 pm. She already takes Sinemet, both IR and CR. 10/10 pain due to degenerative disease of the spine. Oxycodone is managed by her neurologist, who she sees again next month.     Review of Systems No CP, SOB, HA, dizziness, nausea, vomiting, diarrhea, new muscle or joint pain    Patient Active Problem List   Diagnosis Date Noted  . Bunion of right foot 05/17/2015  . Verruca vulgaris 05/17/2015  . Constipation 09/24/2013  . Asthma 11/13/2012  . GERD (gastroesophageal reflux disease) 11/13/2012  . Anxiety and depression 11/13/2012  . Hypothyroidism 11/13/2012  . Essential hypertension, benign 02/21/2012  . Chronic pain 02/21/2012  . Varicose veins   . B12 deficiency anemia   . Osteopenia   . Degenerative arthritis of spine 10/07/2009  . Menopausal and postmenopausal disorder 10/07/2009  . DIVERTICULOSIS OF COLON 02/23/2009  . ABNORMAL WEIGHT GAIN 02/23/2009  . GI BLEEDING 02/22/2009  . DYSPNEA 03/23/2008  . RESTLESS LEG SYNDROME 03/04/2008  . RHINITIS 03/04/2008  . Cough 03/04/2008  . MI (mitral incompetence) 10/07/2006  . Hepatic cyst 10/07/2004     Prior to Admission medications   Medication Sig Start Date End Date Taking? Authorizing Provider  albuterol (PROAIR HFA) 108 (90 BASE) MCG/ACT inhaler INHALE 2 PUFFS BY MOUTH EVERY 4 HOURS AS NEEDED FOR WHEEZE ,COUGH,& FOR  SHORTNESS OF BREATH 09/20/15  Yes Tylyn Stankovich, PA-C  carbidopa-levodopa (SINEMET CR) 50-200 MG tablet Take by mouth 2 (two) times daily. 01/16/16  Yes Historical Provider, MD  carbidopa-levodopa (SINEMET IR) 25-250 MG per tablet  06/08/13  Yes Historical Provider, MD  carvedilol (COREG) 6.25 MG tablet TAKE 1 TABLET BY MOUTH TWICE A DAY WITH A MEAL. 09/20/15  Yes Labarron Durnin, PA-C  Cholecalciferol ([employer]S VIT D 5000 HIGH-POTENCY PO) Take by mouth.   Yes Historical Provider, MD  cyanocobalamin (,VITAMIN B-12,) 1000 MCG/ML injection INJECT 1 ML (1,000 MCG/ML) FOR 30 DAYS 03/15/16  Yes Historical Provider, MD  DULoxetine (CYMBALTA) 60 MG capsule Take 1 capsule (60 mg total) by mouth daily. 09/20/15  Yes Kellen Dutch, PA-C  NEUPRO 4 MG/24HR APPLY 1 PATCH BY TRANSDERMAL ROUTE DAILY FOR 30 DAYS 11/17/15  Yes Historical Provider, MD  oxyCODONE-acetaminophen (PERCOCET) 10-325 MG tablet Take 1 tablet by mouth every 6 (six) hours as needed (pain not relieved by hydrocodone). 09/20/15  Yes Chezney Huether, PA-C  Rotigotine (NEUPRO) 3 MG/24HR PT24 Place onto the skin.   Yes Historical Provider, MD  spironolactone (ALDACTONE) 50 MG tablet TAKE 1 TABLET BY MOUTH EVERY DAY 06/26/16  Yes Tanisia Yokley, PA-C     Allergies  Allergen Reactions  . Keflex [Cephalexin]   . Levofloxacin Itching  . Penicillins     Throat swells       Objective:  Physical Exam  Constitutional: She is oriented to person, place, and time. She appears well-developed and well-nourished. She is active  and cooperative. No distress.  BP 128/80 (BP Location: Right Arm, Patient Position: Sitting, Cuff Size: Normal)   Pulse 68   Temp 98.4 F (36.9 C) (Oral)   Resp 17   Ht 5\' 3"  (1.6 m)   Wt 178 lb (80.7 kg)   SpO2 98%   BMI 31.53 kg/m   HENT:  Head: Normocephalic and atraumatic.  Right Ear: Hearing normal.  Left Ear: Hearing normal.  Eyes: Conjunctivae are normal. No scleral icterus.  Neck: Normal range of motion. Neck supple.  No thyromegaly present.  Cardiovascular: Normal rate, regular rhythm and normal heart sounds.   Pulses:      Radial pulses are 2+ on the right side, and 2+ on the left side.  Pulmonary/Chest: Effort normal. She has wheezes (occasional, scattered).  Lymphadenopathy:       Head (right side): No tonsillar, no preauricular, no posterior auricular and no occipital adenopathy present.       Head (left side): No tonsillar, no preauricular, no posterior auricular and no occipital adenopathy present.    She has no cervical adenopathy.       Right: No supraclavicular adenopathy present.       Left: No supraclavicular adenopathy present.  Neurological: She is alert and oriented to person, place, and time. No sensory deficit.  Skin: Skin is warm, dry and intact. No rash noted. No cyanosis or erythema. Nails show no clubbing.  Psychiatric: She has a normal mood and affect. Her speech is normal and behavior is normal.           Assessment & Plan:   1. Essential hypertension, benign Controlled. Continue current treatment - Comprehensive metabolic panel  2. B12 deficiency anemia CBC because she wants to make sure it's working. - CBC with Differential/Platelet  3. Need for influenza vaccination - Flu Vaccine QUAD 36+ mos IM   Return in about 6 months (around 02/18/2017) for re-evaluation of blood pressure.    Fernande Bras, PA-C Physician Assistant-Certified [facility]

## 2016-08-22 ENCOUNTER — Encounter: Payer: Self-pay | Admitting: Physician Assistant

## 2016-09-20 ENCOUNTER — Other Ambulatory Visit: Payer: Self-pay | Admitting: Physician Assistant

## 2016-09-20 DIAGNOSIS — G8929 Other chronic pain: Secondary | ICD-10-CM

## 2016-09-20 DIAGNOSIS — F32A Depression, unspecified: Secondary | ICD-10-CM

## 2016-09-20 DIAGNOSIS — F329 Major depressive disorder, single episode, unspecified: Secondary | ICD-10-CM

## 2016-09-27 ENCOUNTER — Other Ambulatory Visit: Payer: Self-pay | Admitting: Physician Assistant

## 2016-09-27 DIAGNOSIS — G8929 Other chronic pain: Secondary | ICD-10-CM

## 2016-09-27 DIAGNOSIS — F329 Major depressive disorder, single episode, unspecified: Secondary | ICD-10-CM

## 2016-09-27 DIAGNOSIS — F32A Depression, unspecified: Secondary | ICD-10-CM

## 2016-09-27 NOTE — Telephone Encounter (Signed)
08/2016 last ov and labs 05/2016 last depression exam

## 2016-09-27 NOTE — Telephone Encounter (Signed)
Meds ordered this encounter  Medications  . DULoxetine (CYMBALTA) 60 MG capsule    Sig: TAKE 1 CAPSULE ONCE DAILY    Dispense:  90 capsule    Refill:  1

## 2016-11-22 ENCOUNTER — Other Ambulatory Visit: Payer: Self-pay | Admitting: Physician Assistant

## 2016-11-22 DIAGNOSIS — I10 Essential (primary) hypertension: Secondary | ICD-10-CM

## 2016-11-27 NOTE — Telephone Encounter (Signed)
08/2016 last ov and lab

## 2016-12-22 ENCOUNTER — Other Ambulatory Visit: Payer: Self-pay | Admitting: Physician Assistant

## 2016-12-22 DIAGNOSIS — I10 Essential (primary) hypertension: Secondary | ICD-10-CM

## 2016-12-22 NOTE — Telephone Encounter (Signed)
Meds ordered this encounter  Medications  . spironolactone (ALDACTONE) 50 MG tablet    Sig: TAKE 1 TABLET BY MOUTH EVERY DAY    Dispense:  90 tablet    Refill:  1    Has OV scheduled with me 02/19/17

## 2016-12-22 NOTE — Telephone Encounter (Signed)
Last ov 08/2016 Last refill 06/2016 for 6 months

## 2017-01-22 ENCOUNTER — Other Ambulatory Visit: Payer: Self-pay | Admitting: Physician Assistant

## 2017-01-22 DIAGNOSIS — I10 Essential (primary) hypertension: Secondary | ICD-10-CM

## 2017-02-19 ENCOUNTER — Ambulatory Visit: Payer: Medicare Other | Admitting: Physician Assistant

## 2017-03-24 ENCOUNTER — Other Ambulatory Visit: Payer: Self-pay | Admitting: Physician Assistant

## 2017-03-24 DIAGNOSIS — I10 Essential (primary) hypertension: Secondary | ICD-10-CM

## 2017-04-12 ENCOUNTER — Other Ambulatory Visit: Payer: Self-pay | Admitting: Physician Assistant

## 2017-09-13 ENCOUNTER — Other Ambulatory Visit: Payer: Self-pay | Admitting: Physician Assistant

## 2017-09-13 DIAGNOSIS — I10 Essential (primary) hypertension: Secondary | ICD-10-CM

## 2017-12-11 ENCOUNTER — Other Ambulatory Visit: Payer: Self-pay | Admitting: Physician Assistant

## 2017-12-11 DIAGNOSIS — I10 Essential (primary) hypertension: Secondary | ICD-10-CM

## 2018-03-04 ENCOUNTER — Encounter: Payer: Self-pay | Admitting: Physician Assistant

## 2018-03-10 ENCOUNTER — Other Ambulatory Visit: Payer: Self-pay | Admitting: Physician Assistant

## 2018-03-10 DIAGNOSIS — I10 Essential (primary) hypertension: Secondary | ICD-10-CM
